# Patient Record
Sex: Female | Born: 1980 | Race: White | Hispanic: No | Marital: Single | Smoking: Former smoker
Health system: Southern US, Community
[De-identification: ages and names within clinical notes are randomized; demographics above are authoritative.]

## PROBLEM LIST (undated history)

## (undated) DIAGNOSIS — F988 Other specified behavioral and emotional disorders with onset usually occurring in childhood and adolescence: Secondary | ICD-10-CM

## (undated) DIAGNOSIS — R55 Syncope and collapse: Secondary | ICD-10-CM

## (undated) DIAGNOSIS — K219 Gastro-esophageal reflux disease without esophagitis: Secondary | ICD-10-CM

## (undated) DIAGNOSIS — L503 Dermatographic urticaria: Secondary | ICD-10-CM

## (undated) DIAGNOSIS — IMO0002 Reserved for concepts with insufficient information to code with codable children: Secondary | ICD-10-CM

## (undated) DIAGNOSIS — E663 Overweight: Secondary | ICD-10-CM

## (undated) DIAGNOSIS — R87619 Unspecified abnormal cytological findings in specimens from cervix uteri: Secondary | ICD-10-CM

## (undated) HISTORY — DX: Other specified behavioral and emotional disorders with onset usually occurring in childhood and adolescence: F98.8

## (undated) HISTORY — PX: OTHER SURGICAL HISTORY: SHX169

## (undated) HISTORY — DX: Syncope and collapse: R55

## (undated) HISTORY — PX: LEEP: SHX91

## (undated) HISTORY — PX: OVARIAN CYST REMOVAL: SHX89

## (undated) HISTORY — DX: Gastro-esophageal reflux disease without esophagitis: K21.9

## (undated) HISTORY — PX: WISDOM TOOTH EXTRACTION: SHX21

## (undated) HISTORY — DX: Overweight: E66.3

---
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
## 2001-02-03 ENCOUNTER — Encounter (INDEPENDENT_AMBULATORY_CARE_PROVIDER_SITE_OTHER): Payer: Self-pay | Admitting: Specialist

## 2001-02-03 ENCOUNTER — Other Ambulatory Visit: Admission: RE | Admit: 2001-02-03 | Discharge: 2001-02-03 | Payer: Self-pay | Admitting: *Deleted

## 2001-10-09 ENCOUNTER — Other Ambulatory Visit: Admission: RE | Admit: 2001-10-09 | Discharge: 2001-10-09 | Payer: Self-pay | Admitting: *Deleted

## 2002-10-29 ENCOUNTER — Other Ambulatory Visit: Admission: RE | Admit: 2002-10-29 | Discharge: 2002-10-29 | Payer: Self-pay | Admitting: *Deleted

## 2003-10-29 ENCOUNTER — Other Ambulatory Visit: Admission: RE | Admit: 2003-10-29 | Discharge: 2003-10-29 | Payer: Self-pay | Admitting: *Deleted

## 2004-10-22 ENCOUNTER — Other Ambulatory Visit: Admission: RE | Admit: 2004-10-22 | Discharge: 2004-10-22 | Payer: Self-pay | Admitting: *Deleted

## 2005-11-02 ENCOUNTER — Other Ambulatory Visit: Admission: RE | Admit: 2005-11-02 | Discharge: 2005-11-02 | Payer: Self-pay | Admitting: *Deleted

## 2006-01-10 ENCOUNTER — Other Ambulatory Visit: Admission: RE | Admit: 2006-01-10 | Discharge: 2006-01-10 | Payer: Self-pay | Admitting: *Deleted

## 2006-10-25 ENCOUNTER — Other Ambulatory Visit: Admission: RE | Admit: 2006-10-25 | Discharge: 2006-10-25 | Payer: Self-pay | Admitting: *Deleted

## 2007-11-06 ENCOUNTER — Other Ambulatory Visit: Admission: RE | Admit: 2007-11-06 | Discharge: 2007-11-06 | Payer: Self-pay | Admitting: *Deleted

## 2008-12-16 ENCOUNTER — Ambulatory Visit: Payer: Self-pay | Admitting: Internal Medicine

## 2011-08-10 DIAGNOSIS — F988 Other specified behavioral and emotional disorders with onset usually occurring in childhood and adolescence: Secondary | ICD-10-CM

## 2011-08-10 HISTORY — DX: Other specified behavioral and emotional disorders with onset usually occurring in childhood and adolescence: F98.8

## 2011-10-18 ENCOUNTER — Encounter: Payer: Self-pay | Admitting: Internal Medicine

## 2011-10-18 DIAGNOSIS — N943 Premenstrual tension syndrome: Secondary | ICD-10-CM | POA: Insufficient documentation

## 2011-10-25 ENCOUNTER — Ambulatory Visit (INDEPENDENT_AMBULATORY_CARE_PROVIDER_SITE_OTHER): Payer: BC Managed Care – PPO | Admitting: Internal Medicine

## 2011-10-25 ENCOUNTER — Encounter: Payer: Self-pay | Admitting: Internal Medicine

## 2011-10-25 VITALS — BP 104/64 | Temp 98.4°F | Wt 148.0 lb

## 2011-10-25 DIAGNOSIS — L503 Dermatographic urticaria: Secondary | ICD-10-CM

## 2011-10-25 DIAGNOSIS — R41840 Attention and concentration deficit: Secondary | ICD-10-CM

## 2011-10-25 DIAGNOSIS — K219 Gastro-esophageal reflux disease without esophagitis: Secondary | ICD-10-CM

## 2011-10-25 DIAGNOSIS — F419 Anxiety disorder, unspecified: Secondary | ICD-10-CM

## 2011-10-25 DIAGNOSIS — L509 Urticaria, unspecified: Secondary | ICD-10-CM | POA: Insufficient documentation

## 2011-10-25 DIAGNOSIS — F411 Generalized anxiety disorder: Secondary | ICD-10-CM

## 2011-10-25 HISTORY — DX: Dermatographic urticaria: L50.3

## 2011-10-25 NOTE — Patient Instructions (Signed)
Take Claritin 10 mg daily. Continue with over-the-counter acid blocker. Start Adderall 10 mg at 5 PM on the days you are trying to study. Do not take for work or class. Return in 4 weeks. Counseled regarding alcohol use with amphetamine as well as caffeine use with amphetamine

## 2011-10-25 NOTE — Progress Notes (Signed)
  Subjective:    Patient ID: Traci Rice, female    DOB: 1980-08-30, 30 y.o.   MRN: [id number]  HPI Patient not seen since 09/27/2008. 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 Is also in school he [employer] taking 13 hours. She goes to school 3 nights a week from 6 to 9 PM. Has been having trouble focusing getting her work done when studying on the night she's not in school. Thinks about doing other things. Also has had some issues with dermatographia  and urticaria. Has had GE reflux particularly when drinking soda. Has been taking over-the-counter acid blocker. This is been a hard semester. After this semester she plans to take 2 more classes in the fall of 2013 and finish her degree. She will not be attending school this summer. She took one of her friend's Adderall 30 mg tablets and only took 1/4 of a tablet and did well studying.    Review of Systems     Objective:   Physical Exam Patient not examined today. Spent 20 minutes talking with her about her issues and concerns. Tells me that if she rubs her arm red marks appear. Has had some welts on her neck at times. Has tried some over-the-counter antihistamine but not consistently. Denies being depressed. Just says she has a long her plate right nail and this is her heart is semester. When she's trying to study which is not critically high interest, she found herself thinking about other things to do such as household chores and really can't focus. Says she really didn't have much trouble in high school studying. She's only had this new job about 2 months. Sounds like there's a great deal going on in her life and she could be somewhat overweight.         Assessment & Plan:  Anxiety  GE reflux  Dermatographia  Urticaria  Possible attention deficit  Plan: Trial of Adderall 10 mg (not XR) one by mouth at 5 PM when studying (#30) no refill. Return in 4 weeks. Claritin 10 mg  daily to block histamine. Continue with over-the-counter GE reflux medication.  Time spent with patient 30 minutes

## 2011-11-29 ENCOUNTER — Ambulatory Visit: Payer: BC Managed Care – PPO | Admitting: Internal Medicine

## 2011-12-10 ENCOUNTER — Ambulatory Visit (INDEPENDENT_AMBULATORY_CARE_PROVIDER_SITE_OTHER): Payer: BC Managed Care – PPO | Admitting: Internal Medicine

## 2011-12-10 ENCOUNTER — Encounter: Payer: Self-pay | Admitting: Internal Medicine

## 2011-12-10 DIAGNOSIS — F988 Other specified behavioral and emotional disorders with onset usually occurring in childhood and adolescence: Secondary | ICD-10-CM

## 2011-12-10 NOTE — Patient Instructions (Addendum)
Take Adderall 10 mg twice daily as needed for attention deficit disorder. Return in 6 months for reevaluation. Call 24 hours and advance when new prescriptions are needed

## 2011-12-10 NOTE — Progress Notes (Signed)
  Subjective:    Patient ID: Traci Rice, female    DOB: [birth year], 31 y.o.   MRN: [id number]  HPI patient was seen recently for attention deficit disorder issues. She is now in school [employer]. Plans to go to summer school. She started taking Adderall 10 mg daily and subsequently increased that to twice daily. Seems to help her concentrate much better if she takes a second dose in the afternoon.    Review of Systems     Objective:   Physical Exam not examined spent 15 minutes talking with her about attention deficit disorder issues. She seems pleased with the results.           Assessment & Plan:  Attention deficit disorder  Plan: Prescription written for Adderall 10 mg (not XR) #60  with no refill. She may call in advance in the future for written prescription refills. I will to see her again in 6 months for reevaluation.

## 2012-02-01 ENCOUNTER — Telehealth: Payer: Self-pay | Admitting: Internal Medicine

## 2012-02-01 NOTE — Telephone Encounter (Signed)
New Rx for Adderall 10mg  (#60) one po bid no refill. Pt last seen for med check in May 2013.

## 2012-02-02 ENCOUNTER — Other Ambulatory Visit: Payer: Self-pay

## 2012-02-02 MED ORDER — AMPHETAMINE-DEXTROAMPHETAMINE 10 MG PO TABS: 10.0000 mg | ORAL_TABLET | Freq: Two times a day (BID) | ORAL | Status: DC

## 2012-03-17 ENCOUNTER — Telehealth: Payer: Self-pay | Admitting: Internal Medicine

## 2012-03-17 MED ORDER — AMPHETAMINE-DEXTROAMPHETAMINE 10 MG PO TABS: 10.0000 mg | ORAL_TABLET | Freq: Two times a day (BID) | ORAL | Status: DC

## 2012-03-17 NOTE — Telephone Encounter (Signed)
Rx printed for Adderall 10 mg bid #60

## 2012-04-21 ENCOUNTER — Other Ambulatory Visit: Payer: Self-pay | Admitting: Obstetrics and Gynecology

## 2012-05-09 ENCOUNTER — Telehealth: Payer: Self-pay | Admitting: Internal Medicine

## 2012-05-09 MED ORDER — AMPHETAMINE-DEXTROAMPHETAMINE 10 MG PO TABS: 10.0000 mg | ORAL_TABLET | Freq: Two times a day (BID) | ORAL | Status: DC

## 2012-05-09 NOTE — Telephone Encounter (Signed)
Rx given to pt.   6 mo recheck made for 06/20/12 @ 3:45.

## 2012-06-15 ENCOUNTER — Telehealth: Payer: Self-pay | Admitting: Internal Medicine

## 2012-06-15 MED ORDER — AMPHETAMINE-DEXTROAMPHETAMINE 10 MG PO TABS: 10.0000 mg | ORAL_TABLET | Freq: Two times a day (BID) | ORAL | Status: DC

## 2012-06-15 NOTE — Telephone Encounter (Signed)
Please print up Rx

## 2012-06-20 ENCOUNTER — Ambulatory Visit (INDEPENDENT_AMBULATORY_CARE_PROVIDER_SITE_OTHER): Payer: 59 | Admitting: Internal Medicine

## 2012-06-20 ENCOUNTER — Encounter: Payer: Self-pay | Admitting: Internal Medicine

## 2012-06-20 VITALS — BP 110/72 | Temp 98.5°F | Wt 137.0 lb

## 2012-06-20 DIAGNOSIS — F988 Other specified behavioral and emotional disorders with onset usually occurring in childhood and adolescence: Secondary | ICD-10-CM

## 2012-06-20 NOTE — Patient Instructions (Addendum)
Continue same dose of Adderall and return in 6 months.

## 2012-06-20 NOTE — Progress Notes (Signed)
  Subjective:    Patient ID: Traci Rice, female    DOB: 1980/09/19, 31 y.o.   MRN: [id number]  HPI 31 year old white female who since early summer 2013 has been working for her parents at their company [employer]. 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 Now she regrets this decision. She says it's hard to focus on many different tasks. It's hard to multitask. She been taking Adderall 10 mg twice daily now for some time. Initially it was to help her get through school at [employer] which she finished in the Spring of 2013. Feels that she may have developed some tolerance to this dose of Adderall. It wears off about 4:00pm. Then she goes to the gym to work out. Then she has ordinary evening activities. She's not studying anymore in the evenings so I do not see that that is an issue with the medication wearing off. Admits that it gives her some energy. I told her she is not taking it for anergy but for focusing. I told her not sure she really needs to continue with this medication since she's now out of school and doesn't really need to focus as much as she did previously when she was in school. It seems that she finds it very stressful for can for her parents. 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  She declines influenza immunization today.    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 That is the real issue. Do not want to change dose of medication at this point in time. We really need to think about whether or not she  needs his medication to focus in her current situation. 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 We will reevaluate in 6 months. She may be ready to get another job at that point.

## 2012-08-09 NOTE — L&D Delivery Note (Signed)
Delivery Note  SVD viable female Apgars 9,9 over 2nd degree ML lac.  Placenta delivered spontaneously intact with 3VC. Repair with 2-0 Chromic with good support and hemostasis noted and R/V exam confirms.  PH art was sent.  [place] cord blood was not done.  Mother and baby were doing well.  EBL 350cc  Candice Camp, MD

## 2012-10-16 LAB — OB RESULTS CONSOLE ABO/RH

## 2012-10-16 LAB — OB RESULTS CONSOLE HEPATITIS B SURFACE ANTIGEN: Hepatitis B Surface Ag: NEGATIVE

## 2012-10-16 LAB — OB RESULTS CONSOLE RUBELLA ANTIBODY, IGM: Rubella: IMMUNE

## 2012-10-16 LAB — OB RESULTS CONSOLE GC/CHLAMYDIA
Chlamydia: NEGATIVE
Gonorrhea: NEGATIVE

## 2013-01-30 ENCOUNTER — Ambulatory Visit (INDEPENDENT_AMBULATORY_CARE_PROVIDER_SITE_OTHER): Payer: BC Managed Care – PPO | Admitting: Internal Medicine

## 2013-01-30 ENCOUNTER — Encounter: Payer: Self-pay | Admitting: Internal Medicine

## 2013-01-30 VITALS — BP 110/56 | HR 88 | Temp 99.0°F | Wt 173.0 lb

## 2013-01-30 DIAGNOSIS — J029 Acute pharyngitis, unspecified: Secondary | ICD-10-CM

## 2013-01-30 DIAGNOSIS — J039 Acute tonsillitis, unspecified: Secondary | ICD-10-CM

## 2013-01-30 DIAGNOSIS — Z3492 Encounter for supervision of normal pregnancy, unspecified, second trimester: Secondary | ICD-10-CM

## 2013-01-30 DIAGNOSIS — Z331 Pregnant state, incidental: Secondary | ICD-10-CM

## 2013-01-30 NOTE — Patient Instructions (Addendum)
Take amoxicillin 500 mg 3 times daily for 10 days. May take Tylenol and Sudafed if needed.

## 2013-01-30 NOTE — Progress Notes (Signed)
  Subjective:    Patient ID: Traci Rice, female    DOB: [birth year], 32 y.o.   MRN: [id number]  HPI  Traci Rice is 32 years old in 6 months pregnant. Her pregnancy has been unremarkable so far and said she now has developed a sore throat a couple of days ago. She noticed her tonsils were quite red. She has some ear congestion. Some nasal congestion. No fever or shaking chills. No known drug allergies. She is worried about strep throat.    Review of Systems     Objective:   Physical Exam tonsils are enlarged and red without exudate. Rapid strep screen is negative. TMs are full but not red bilaterally. Right anterior cervical node. Neck is supple. Chest is clear to auscultation without rales or wheezing.        Assessment & Plan:  Tonsillitis  Six-month intrauterine pregnancy  Plan: Amoxicillin 500 mg 3 times daily for 10 days. Explained to her it is safe to take Tylenol, Robitussin, Sudafed while pregnant.

## 2013-02-01 ENCOUNTER — Telehealth: Payer: Self-pay

## 2013-02-01 NOTE — Telephone Encounter (Signed)
Suspect this is viral related and not an allergic reaction. Continue with antibiotic.

## 2013-02-01 NOTE — Telephone Encounter (Signed)
Patient is feeling better, throat wise, but has developed little red bumps on the palms of her hands and a couple of her toes. No itching, just sensitive to touch. No fever.

## 2013-02-02 NOTE — Telephone Encounter (Signed)
Patient informed. 

## 2013-04-04 LAB — OB RESULTS CONSOLE GBS: GBS: NEGATIVE

## 2013-04-10 ENCOUNTER — Inpatient Hospital Stay (HOSPITAL_COMMUNITY)
Admission: AD | Admit: 2013-04-10 | Discharge: 2013-04-10 | Disposition: A | Discharge status: Home / Self Care | Payer: BC Managed Care – PPO | Source: Ambulatory Visit | Attending: Obstetrics and Gynecology | Admitting: Obstetrics and Gynecology

## 2013-04-10 ENCOUNTER — Encounter (HOSPITAL_COMMUNITY): Payer: Self-pay | Admitting: *Deleted

## 2013-04-10 DIAGNOSIS — O479 False labor, unspecified: Secondary | ICD-10-CM | POA: Insufficient documentation

## 2013-04-10 HISTORY — DX: Unspecified abnormal cytological findings in specimens from cervix uteri: R87.619

## 2013-04-10 HISTORY — DX: Reserved for concepts with insufficient information to code with codable children: IMO0002

## 2013-04-16 ENCOUNTER — Inpatient Hospital Stay (HOSPITAL_COMMUNITY)
Admission: AD | Admit: 2013-04-16 | Discharge: 2013-04-18 | DRG: 373 | Disposition: A | Discharge status: Home / Self Care | Payer: BC Managed Care – PPO | Source: Ambulatory Visit | Attending: Obstetrics and Gynecology | Admitting: Obstetrics and Gynecology

## 2013-04-16 ENCOUNTER — Encounter (HOSPITAL_COMMUNITY): Payer: Self-pay | Admitting: Anesthesiology

## 2013-04-16 ENCOUNTER — Encounter (HOSPITAL_COMMUNITY): Payer: Self-pay | Admitting: *Deleted

## 2013-04-16 ENCOUNTER — Inpatient Hospital Stay (HOSPITAL_COMMUNITY): Payer: BC Managed Care – PPO | Admitting: Anesthesiology

## 2013-04-16 LAB — CBC
HCT: 35.1 % — ABNORMAL LOW (ref 36.0–46.0)
Hemoglobin: 12.2 g/dL (ref 12.0–15.0)
MCH: 31.1 pg (ref 26.0–34.0)
MCHC: 34.8 g/dL (ref 30.0–36.0)
MCV: 89.5 fL (ref 78.0–100.0)

## 2013-04-16 MED ORDER — EPHEDRINE 5 MG/ML INJ
10.0000 mg | INTRAVENOUS | Status: DC | PRN
  Filled 2013-04-16: qty 2

## 2013-04-16 MED ORDER — BUTORPHANOL TARTRATE 1 MG/ML IJ SOLN
1.0000 mg | Freq: Once | INTRAMUSCULAR | Status: AC
  Administered 2013-04-16: 1 mg via INTRAVENOUS
  Filled 2013-04-16: qty 1

## 2013-04-16 MED ORDER — DIPHENHYDRAMINE HCL 50 MG/ML IJ SOLN: 12.5000 mg | INTRAMUSCULAR | Status: DC | PRN

## 2013-04-16 MED ORDER — EPHEDRINE 5 MG/ML INJ
10.0000 mg | INTRAVENOUS | Status: DC | PRN
  Filled 2013-04-16: qty 4
  Filled 2013-04-16: qty 2

## 2013-04-16 MED ORDER — DIBUCAINE 1 % RE OINT: 1.0000 "application " | TOPICAL_OINTMENT | RECTAL | Status: DC | PRN

## 2013-04-16 MED ORDER — ONDANSETRON HCL 4 MG PO TABS: 4.0000 mg | ORAL_TABLET | ORAL | Status: DC | PRN

## 2013-04-16 MED ORDER — ZOLPIDEM TARTRATE 5 MG PO TABS: 5.0000 mg | ORAL_TABLET | Freq: Every evening | ORAL | Status: DC | PRN

## 2013-04-16 MED ORDER — OXYTOCIN 40 UNITS IN LACTATED RINGERS INFUSION - SIMPLE MED
1.0000 m[IU]/min | INTRAVENOUS | Status: DC
  Administered 2013-04-16: 2 m[IU]/min via INTRAVENOUS
  Filled 2013-04-16: qty 1000

## 2013-04-16 MED ORDER — FLEET ENEMA 7-19 GM/118ML RE ENEM: 1.0000 | ENEMA | RECTAL | Status: DC | PRN

## 2013-04-16 MED ORDER — PRENATAL MULTIVITAMIN CH
1.0000 | ORAL_TABLET | Freq: Every day | ORAL | Status: DC
  Filled 2013-04-16: qty 1

## 2013-04-16 MED ORDER — CITRIC ACID-SODIUM CITRATE 334-500 MG/5ML PO SOLN: 30.0000 mL | ORAL | Status: DC | PRN

## 2013-04-16 MED ORDER — IBUPROFEN 600 MG PO TABS: 600.0000 mg | ORAL_TABLET | Freq: Four times a day (QID) | ORAL | Status: DC | PRN

## 2013-04-16 MED ORDER — LACTATED RINGERS IV SOLN
500.0000 mL | Freq: Once | INTRAVENOUS | Status: AC
  Administered 2013-04-16: 500 mL via INTRAVENOUS

## 2013-04-16 MED ORDER — PROMETHAZINE HCL 25 MG/ML IJ SOLN
12.5000 mg | Freq: Four times a day (QID) | INTRAMUSCULAR | Status: DC | PRN
  Administered 2013-04-16: 12.5 mg via INTRAVENOUS
  Filled 2013-04-16: qty 1

## 2013-04-16 MED ORDER — PRENATAL MULTIVITAMIN CH: 1.0000 | ORAL_TABLET | Freq: Every day | ORAL | Status: DC

## 2013-04-16 MED ORDER — BENZOCAINE-MENTHOL 20-0.5 % EX AERO
1.0000 "application " | INHALATION_SPRAY | CUTANEOUS | Status: DC | PRN
  Administered 2013-04-17: 1 via TOPICAL
  Filled 2013-04-16: qty 56

## 2013-04-16 MED ORDER — ONDANSETRON HCL 4 MG/2ML IJ SOLN: 4.0000 mg | INTRAMUSCULAR | Status: DC | PRN

## 2013-04-16 MED ORDER — OXYCODONE-ACETAMINOPHEN 5-325 MG PO TABS: 1.0000 | ORAL_TABLET | ORAL | Status: DC | PRN

## 2013-04-16 MED ORDER — SIMETHICONE 80 MG PO CHEW: 80.0000 mg | CHEWABLE_TABLET | ORAL | Status: DC | PRN

## 2013-04-16 MED ORDER — SENNOSIDES-DOCUSATE SODIUM 8.6-50 MG PO TABS: 2.0000 | ORAL_TABLET | Freq: Every day | ORAL | Status: DC

## 2013-04-16 MED ORDER — PHENYLEPHRINE 40 MCG/ML (10ML) SYRINGE FOR IV PUSH (FOR BLOOD PRESSURE SUPPORT)
80.0000 ug | PREFILLED_SYRINGE | INTRAVENOUS | Status: DC | PRN
  Filled 2013-04-16: qty 2

## 2013-04-16 MED ORDER — LACTATED RINGERS IV SOLN: 500.0000 mL | INTRAVENOUS | Status: DC | PRN

## 2013-04-16 MED ORDER — WITCH HAZEL-GLYCERIN EX PADS: 1.0000 "application " | MEDICATED_PAD | CUTANEOUS | Status: DC | PRN

## 2013-04-16 MED ORDER — IBUPROFEN 600 MG PO TABS
600.0000 mg | ORAL_TABLET | Freq: Four times a day (QID) | ORAL | Status: DC
  Administered 2013-04-16 – 2013-04-18 (×7): 600 mg via ORAL
  Filled 2013-04-16 (×7): qty 1

## 2013-04-16 MED ORDER — LACTATED RINGERS IV SOLN
INTRAVENOUS | Status: DC
  Administered 2013-04-16: 125 mL/h via INTRAVENOUS

## 2013-04-16 MED ORDER — MEDROXYPROGESTERONE ACETATE 150 MG/ML IM SUSP: 150.0000 mg | INTRAMUSCULAR | Status: DC | PRN

## 2013-04-16 MED ORDER — PHENYLEPHRINE 40 MCG/ML (10ML) SYRINGE FOR IV PUSH (FOR BLOOD PRESSURE SUPPORT)
80.0000 ug | PREFILLED_SYRINGE | INTRAVENOUS | Status: DC | PRN
  Filled 2013-04-16: qty 2
  Filled 2013-04-16: qty 5

## 2013-04-16 MED ORDER — OXYTOCIN 40 UNITS IN LACTATED RINGERS INFUSION - SIMPLE MED
62.5000 mL/h | INTRAVENOUS | Status: DC
  Administered 2013-04-16: 62.5 mL/h via INTRAVENOUS

## 2013-04-16 MED ORDER — ACETAMINOPHEN 325 MG PO TABS: 650.0000 mg | ORAL_TABLET | ORAL | Status: DC | PRN

## 2013-04-16 MED ORDER — FENTANYL 2.5 MCG/ML BUPIVACAINE 1/10 % EPIDURAL INFUSION (WH - ANES)
14.0000 mL/h | INTRAMUSCULAR | Status: DC | PRN
  Administered 2013-04-16: 14 mL/h via EPIDURAL
  Filled 2013-04-16: qty 125

## 2013-04-16 MED ORDER — LIDOCAINE HCL (PF) 1 % IJ SOLN
30.0000 mL | INTRAMUSCULAR | Status: DC | PRN
  Filled 2013-04-16 (×2): qty 30

## 2013-04-16 MED ORDER — DIPHENHYDRAMINE HCL 25 MG PO CAPS: 25.0000 mg | ORAL_CAPSULE | Freq: Four times a day (QID) | ORAL | Status: DC | PRN

## 2013-04-16 MED ORDER — TETANUS-DIPHTH-ACELL PERTUSSIS 5-2.5-18.5 LF-MCG/0.5 IM SUSP: 0.5000 mL | Freq: Once | INTRAMUSCULAR | Status: DC

## 2013-04-16 MED ORDER — OXYTOCIN BOLUS FROM INFUSION: 500.0000 mL | INTRAVENOUS | Status: DC

## 2013-04-16 MED ORDER — MEASLES, MUMPS & RUBELLA VAC ~~LOC~~ INJ: 0.5000 mL | INJECTION | Freq: Once | SUBCUTANEOUS | Status: DC

## 2013-04-16 MED ORDER — TERBUTALINE SULFATE 1 MG/ML IJ SOLN: 0.2500 mg | Freq: Once | INTRAMUSCULAR | Status: DC | PRN

## 2013-04-16 MED ORDER — ONDANSETRON HCL 4 MG/2ML IJ SOLN: 4.0000 mg | Freq: Four times a day (QID) | INTRAMUSCULAR | Status: DC | PRN

## 2013-04-16 MED ORDER — SODIUM BICARBONATE 8.4 % IV SOLN
INTRAVENOUS | Status: DC | PRN
  Administered 2013-04-16: 5 mL via EPIDURAL

## 2013-04-16 MED ORDER — LANOLIN HYDROUS EX OINT: TOPICAL_OINTMENT | CUTANEOUS | Status: DC | PRN

## 2013-04-16 NOTE — Progress Notes (Signed)
Pt grossly ruptured. Fern positive.

## 2013-04-16 NOTE — Anesthesia Procedure Notes (Signed)

## 2013-04-16 NOTE — MAU Note (Signed)
Pt G1 at 37.6wks, leaking clear fluid since 0500.  Mild contractions, no bleeding.

## 2013-04-16 NOTE — H&P (Signed)
Traci Rice is a 32 y.o. female presenting for SROM and early labor.  Pregnancy uncomplicated.  GBS-. History OB History   Grav Para Term Preterm Abortions TAB SAB Ect Mult Living   1              Past Medical History  Diagnosis Date  . Syncope   . PMS (premenstrual syndrome)   . Abnormal Pap smear    Past Surgical History  Procedure Laterality Date  . Leep    . Laporoscopy      remove cyst from left ovary  . Ovarian cyst removal     Family History: family history includes Cancer in her paternal grandmother; Diabetes in her maternal grandmother, mother, and paternal grandmother; Heart disease in her paternal aunt; Stroke in her paternal grandfather. Social History:  reports that she quit smoking about 8 months ago. Her smoking use included Cigarettes. She smoked 0.00 packs per day. She has never used smokeless tobacco. She reports that she does not drink alcohol or use illicit drugs.   Prenatal Transfer Tool  Maternal Diabetes: No Genetic Screening: Normal Maternal Ultrasounds/Referrals: Normal Fetal Ultrasounds or other Referrals:  None Maternal Substance Abuse:  No Significant Maternal Medications:  None Significant Maternal Lab Results:  None Other Comments:  None  ROS  Dilation: 3 Effacement (%): 100 Station: -2 Exam by:: Eathon Valade Blood pressure 130/85, pulse 94, temperature 98.6 F (37 Rice), temperature source Oral, resp. rate 20, height 5' 4.4" (1.636 m), weight 87.816 kg (193 lb 9.6 oz), last menstrual period 05/30/2012. Exam Physical Exam  Prenatal labs: ABO, Rh: A/Positive/-- (03/10 0000) Antibody: Negative (03/10 0000) Rubella: Immune (03/10 0000) RPR: Nonreactive (03/10 0000)  HBsAg: Negative (03/10 0000)  HIV: Non-reactive (03/10 0000)  GBS: Negative (08/27 0000)   Assessment/Plan: IUP at term SROM and early labor Augment with Pitocin Anticipate SVD   Traci Rice 04/16/2013, 8:37 AM

## 2013-04-16 NOTE — Anesthesia Preprocedure Evaluation (Signed)
Anesthesia Evaluation  Patient identified by MRN, date of birth, ID band Patient awake    Reviewed: Allergy & Precautions, H&P , Patient's Chart, lab work & pertinent test results  Airway Mallampati: II  TM Distance: >3 FB Neck ROM: full    Dental  (+) Teeth Intact   Pulmonary  breath sounds clear to auscultation        Cardiovascular Rhythm:regular Rate:Normal     Neuro/Psych    GI/Hepatic Medicated,  Endo/Other    Renal/GU      Musculoskeletal   Abdominal   Peds  Hematology   Anesthesia Other Findings       Reproductive/Obstetrics (+) Pregnancy                             Anesthesia Physical Anesthesia Plan  ASA: II  Anesthesia Plan: Epidural   Post-op Pain Management:    Induction:   Airway Management Planned:   Additional Equipment:   Intra-op Plan:   Post-operative Plan:   Informed Consent: I have reviewed the patients History and Physical, chart, labs and discussed the procedure including the risks, benefits and alternatives for the proposed anesthesia with the patient or authorized representative who has indicated his/her understanding and acceptance.   Dental Advisory Given  Plan Discussed with:   Anesthesia Plan Comments: (Labs checked- platelets confirmed with RN in room. Fetal heart tracing, per RN, reported to be stable enough for sitting procedure. Discussed epidural, and patient consents to the procedure:  included risk of possible headache,backache, failed block, allergic reaction, and nerve injury. This patient was asked if she had any questions or concerns before the procedure started.)        Anesthesia Quick Evaluation  

## 2013-04-17 LAB — CBC
Platelets: 189 10*3/uL (ref 150–400)
RDW: 13 % (ref 11.5–15.5)
WBC: 14.1 10*3/uL — ABNORMAL HIGH (ref 4.0–10.5)

## 2013-04-17 NOTE — Progress Notes (Signed)
Patient has bruises to both nipples and is extremely sore.  Upon entering her room this morning she asked for formula and said that she didn't want to breastfeed anymore.   Discussed with patient the benefits of breastfeeding.  Patient had been given sore nipple shells, lanolin, and a nipple shield (which hadn't been started at that time).  She decided that she would give pumping a try and would pump and bottle feed instead, while supplementing with formula until her milk comes in.  We made a new plan and I discussed this plan with Rayfield Citizen from [facility].  I also returned the unopened nipple shield to [facility] so she could be credited that charge.  Patient is happy at this moment with her choice of feeding.   Cox, Caydn Justen M

## 2013-04-17 NOTE — Progress Notes (Signed)
Post Partum Day 1 Subjective: no complaints, up ad lib, voiding, tolerating PO and + flatus  Objective: Blood pressure 113/74, pulse 96, temperature 98.3 F (36.8 C), temperature source Oral, resp. rate 18, height 5' 4.4" (1.636 m), weight 193 lb 9.6 oz (87.816 kg), last menstrual period 05/30/2012, SpO2 100.00%, unknown if currently breastfeeding.  Physical Exam:  General: alert and cooperative Lochia: appropriate Uterine Fundus: firm Incision: perineum intact DVT Evaluation: No evidence of DVT seen on physical exam. Negative Homan's sign. No cords or calf tenderness. No significant calf/ankle edema.   Recent Labs  04/16/13 0650 04/17/13 0545  HGB 12.2 10.5*  HCT 35.1* 31.0*    Assessment/Plan: Plan for discharge tomorrow   LOS: 1 day   CURTIS,CAROL G 04/17/2013, 8:10 AM

## 2013-04-17 NOTE — Lactation Note (Signed)
This note was copied from the chart of Traci Rice. Lactation Consultation Note  Patient Name: Traci Rice [id number] Date: 04/17/2013 Reason for consult: Initial assessment Mom stopped putting the baby to the breast due to nipple pain. Bilateral nipples are bruised red, nipples are flat with some aerola edema. Mom has DEBP set up and was given shells to wear but she does not have shells on at present. Offered to demonstrate a nipple shield if Mom wanted to get baby back to the breast. She declined and at this point is not sure if she wants to pump and bottle or change to formula and bottle feeding. Discussed the benefits of giving breast milk to baby v/s formula. Discussed benefits of BF for Mom. Advised Mom if she decides to continue to pump and bottle feed, stressed importance of pumping every 3 hours for 15 minutes on Preemie setting. Advised to call insurance company regarding obtaining a DEBP for home use. If Mom decides to formula and bottle feed, then advised not to pump. Encouraged Mom to call if she has questions or concerns or would like LC assist. Care for sore nipples reviewed with Mom and comfort gels given with instructions.   Maternal Data Formula Feeding for Exclusion: Yes Reason for exclusion: Mother's choice to formula and breast feed on admission Infant to breast within first hour of birth: Yes Has patient been taught Hand Expression?: Yes (Mom reports RN demonstrated hand expression) Does the patient have breastfeeding experience prior to this delivery?: No  Feeding Feeding Type: Formula Nipple Type: Regular  LATCH Score/Interventions       Type of Nipple: Flat  Comfort (Breast/Nipple): Filling, red/small blisters or bruises, mild/mod discomfort  Problem noted: Cracked, bleeding, blisters, bruises;Severe discomfort Interventions  (Cracked/bleeding/bruising/blister): Expressed breast milk to nipple ( comfort gels given with instructions)         Lactation Tools Discussed/Used Tools: Pump;Shells;Comfort gels Shell Type: Inverted Breast pump type: Double-Electric Breast Pump   Consult Status Consult Status: PRN    Alfred Levins 04/17/2013, 5:35 PM

## 2013-04-17 NOTE — Anesthesia Postprocedure Evaluation (Signed)
  Anesthesia Post-op Note  Patient: Traci Rice  Procedure(s) Performed: * No procedures listed *  Patient Location: PACU and Mother/Baby  Anesthesia Type:Epidural  Level of Consciousness: awake, alert  and oriented  Airway and Oxygen Therapy: Patient Spontanous Breathing  Post-op Pain: none  Post-op Assessment: Post-op Vital signs reviewed, Patient's Cardiovascular Status Stable, No headache, No backache, No residual numbness and No residual motor weakness  Post-op Vital Signs: Reviewed and stable  Complications: No apparent anesthesia complications

## 2013-04-18 NOTE — Discharge Summary (Signed)
Obstetric Discharge Summary Reason for Admission: onset of labor and rupture of membranes Prenatal Procedures: ultrasound Intrapartum Procedures: spontaneous vaginal delivery Postpartum Procedures: none Complications-Operative and Postpartum: 2 degree perineal laceration Hemoglobin  Date Value Range Status  04/17/2013 10.5* 12.0 - 15.0 g/dL Final     HCT  Date Value Range Status  04/17/2013 31.0* 36.0 - 46.0 % Final    Physical Exam:  General: alert and cooperative Lochia: appropriate Uterine Fundus: firm Incision: perineum intact DVT Evaluation: No evidence of DVT seen on physical exam. Negative Homan's sign. No cords or calf tenderness. No significant calf/ankle edema.  Discharge Diagnoses: Term Pregnancy-delivered  Discharge Information: Date: 04/18/2013 Activity: pelvic rest Diet: routine Medications: PNV and Ibuprofen Condition: stable Instructions: refer to practice specific booklet Discharge to: home   Newborn Data: Live born female  Birth Weight: 7 lb 8.8 oz (3425 g) APGAR: 9, 9  Home with mother.  Dionne Knoop G 04/18/2013, 8:33 AM

## 2014-04-22 ENCOUNTER — Ambulatory Visit (INDEPENDENT_AMBULATORY_CARE_PROVIDER_SITE_OTHER): Payer: BC Managed Care – PPO | Admitting: Internal Medicine

## 2014-04-22 ENCOUNTER — Encounter: Payer: Self-pay | Admitting: Internal Medicine

## 2014-04-22 VITALS — BP 102/72 | HR 68 | Ht 64.5 in | Wt 193.0 lb

## 2014-04-22 DIAGNOSIS — F411 Generalized anxiety disorder: Secondary | ICD-10-CM

## 2014-04-22 MED ORDER — SERTRALINE HCL 50 MG PO TABS: 50.0000 mg | ORAL_TABLET | Freq: Every day | ORAL | Status: DC

## 2014-05-07 NOTE — Progress Notes (Signed)
   Subjective:    Patient ID: Traci Rice, female    DOB: 02/24/1981, 33 y.o.   MRN: [id number]  HPI 33 year old Female not seen since June 2014. In September 2014 she delivered a baby. She has a long-standing history of anxiety and comes in today to discuss anxiety. Prefers not to be on antianxiety medication but would prefer to try something like SSRI. Doesn't was something that will cause weight gain.    Review of Systems     Objective:   Physical Exam  Spent 20 minutes speaking with patient about these issues. Her affect is normal. Judgment is normal.      Assessment & Plan:  Anxiety disorder  Plan: Start with Zoloft 50 mg daily and followup in 6 weeks.

## 2014-05-07 NOTE — Patient Instructions (Signed)
Start Zoloft 50 mg daily and return in 6 weeks

## 2014-06-07 ENCOUNTER — Other Ambulatory Visit: Payer: Self-pay | Admitting: Obstetrics and Gynecology

## 2014-06-07 ENCOUNTER — Ambulatory Visit: Payer: BC Managed Care – PPO | Admitting: Internal Medicine

## 2014-06-10 ENCOUNTER — Encounter: Payer: Self-pay | Admitting: Internal Medicine

## 2014-06-10 LAB — CYTOLOGY - PAP

## 2014-06-13 ENCOUNTER — Encounter: Payer: Self-pay | Admitting: Internal Medicine

## 2014-06-13 ENCOUNTER — Ambulatory Visit (INDEPENDENT_AMBULATORY_CARE_PROVIDER_SITE_OTHER): Payer: BC Managed Care – PPO | Admitting: Internal Medicine

## 2014-06-13 VITALS — BP 98/72 | HR 99 | Temp 97.9°F | Wt 157.0 lb

## 2014-06-13 DIAGNOSIS — F411 Generalized anxiety disorder: Secondary | ICD-10-CM

## 2014-06-13 MED ORDER — SERTRALINE HCL 100 MG PO TABS: 100.0000 mg | ORAL_TABLET | Freq: Every day | ORAL | Status: DC

## 2014-06-13 NOTE — Patient Instructions (Addendum)
Increase Zoloft to 100 mg daily. Return in 6 months.

## 2014-09-14 NOTE — Progress Notes (Signed)
   Subjective:    Patient ID: Traci Rice, female    DOB: 02/15/1981, 34 y.o.   MRN: [id number]  HPI 6 week follow-up visit for anxiety. Was seen in September for anxiety issues status post new baby. Started on Zoloft 50 mg daily. Is done well with that medication. No side effects.    Review of Systems     Objective:   Physical Exam  Not examined. The 15 minutes speaking with patient. Her affect is normal and much brighter and calmer than last visit.      Assessment & Plan:  Anxiety  Plan: Increase Zoloft to 100 mg daily. Return in 6 months or as needed.

## 2014-11-19 ENCOUNTER — Encounter: Payer: Self-pay | Admitting: Internal Medicine

## 2014-11-19 ENCOUNTER — Ambulatory Visit (INDEPENDENT_AMBULATORY_CARE_PROVIDER_SITE_OTHER): Payer: BLUE CROSS/BLUE SHIELD | Admitting: Internal Medicine

## 2014-11-19 VITALS — BP 100/70 | HR 72 | Temp 97.9°F

## 2014-11-19 DIAGNOSIS — R197 Diarrhea, unspecified: Secondary | ICD-10-CM

## 2014-11-19 DIAGNOSIS — F411 Generalized anxiety disorder: Secondary | ICD-10-CM

## 2014-11-19 MED ORDER — SERTRALINE HCL 100 MG PO TABS: 100.0000 mg | ORAL_TABLET | Freq: Every day | ORAL | Status: DC

## 2014-12-07 ENCOUNTER — Encounter: Payer: Self-pay | Admitting: Internal Medicine

## 2014-12-07 NOTE — Patient Instructions (Signed)
Recommend clear liquid diet until diarrhea has stopped for 24 hours and then advance diet slowly. Continue Zoloft 100 mg daily. Return in 6 months.

## 2014-12-07 NOTE — Progress Notes (Signed)
   Subjective:    Patient ID: Traci Rice, female    DOB: 12/30/1980, 34 y.o.   MRN: [id number]  HPI Was last seen in early November for follow-up on anxiety. She is on Zoloft and has done well. She has her daughter with her today. Daughter was born in 2014. Also she began having diarrhea today and has had multiple episodes. Likely has viral gastroenteritis. She has no fever.    Review of Systems     Objective:   Physical Exam Spent 15 minutes speaking with patient about anxiety issues.       Assessment & Plan:  Generalized anxiety disorder  Diarrhea  Plan: Refill Zoloft for 6 months. Return in 6 months. With regard to diarrhea, clear liquid diet until diarrhea resolves for 24 hours then advance diet slowly.

## 2015-05-21 ENCOUNTER — Encounter: Payer: Medicaid Other | Admitting: Obstetrics & Gynecology

## 2015-05-22 ENCOUNTER — Other Ambulatory Visit: Payer: BLUE CROSS/BLUE SHIELD | Admitting: Internal Medicine

## 2015-05-22 DIAGNOSIS — Z130 Encounter for screening for diseases of the blood and blood-forming organs and certain disorders involving the immune mechanism: Secondary | ICD-10-CM

## 2015-05-22 DIAGNOSIS — Z0000 Encounter for general adult medical examination without abnormal findings: Secondary | ICD-10-CM

## 2015-05-22 DIAGNOSIS — Z1321 Encounter for screening for nutritional disorder: Secondary | ICD-10-CM

## 2015-05-22 DIAGNOSIS — Z13220 Encounter for screening for lipoid disorders: Secondary | ICD-10-CM

## 2015-05-22 DIAGNOSIS — Z1329 Encounter for screening for other suspected endocrine disorder: Secondary | ICD-10-CM

## 2015-05-22 LAB — LIPID PANEL
Cholesterol: 152 mg/dL (ref 125–200)
HDL: 56 mg/dL (ref >=46)
LDL CALC: 86 mg/dL (ref <130)
TRIGLYCERIDES: 51 mg/dL (ref <150)
Total CHOL/HDL Ratio: 2.7 Ratio (ref <=5.0)
VLDL: 10 mg/dL (ref <30)

## 2015-05-22 LAB — CBC WITH DIFFERENTIAL/PLATELET
BASOS ABS: 0 10*3/uL (ref 0.0–0.1)
Basophils Relative: 0 % (ref 0–1)
EOS ABS: 0.1 10*3/uL (ref 0.0–0.7)
EOS PCT: 1 % (ref 0–5)
HCT: 40.4 % (ref 36.0–46.0)
Hemoglobin: 13.5 g/dL (ref 12.0–15.0)
Lymphocytes Relative: 26 % (ref 12–46)
Lymphs Abs: 1.7 10*3/uL (ref 0.7–4.0)
MCH: 30.5 pg (ref 26.0–34.0)
MCHC: 33.4 g/dL (ref 30.0–36.0)
MCV: 91.4 fL (ref 78.0–100.0)
MPV: 10.2 fL (ref 8.6–12.4)
Monocytes Absolute: 0.7 10*3/uL (ref 0.1–1.0)
Monocytes Relative: 11 % (ref 3–12)
NEUTROS PCT: 62 % (ref 43–77)
Neutro Abs: 4 10*3/uL (ref 1.7–7.7)
PLATELETS: 290 10*3/uL (ref 150–400)
RBC: 4.42 MIL/uL (ref 3.87–5.11)
RDW: 12.7 % (ref 11.5–15.5)
WBC: 6.4 10*3/uL (ref 4.0–10.5)

## 2015-05-22 LAB — TSH: TSH: 0.616 u[IU]/mL (ref 0.350–4.500)

## 2015-05-22 LAB — COMPLETE METABOLIC PANEL WITH GFR
ALT: 9 U/L (ref 6–29)
AST: 14 U/L (ref 10–30)
Albumin: 4.5 g/dL (ref 3.6–5.1)
Alkaline Phosphatase: 72 U/L (ref 33–115)
BUN: 9 mg/dL (ref 7–25)
CHLORIDE: 102 mmol/L (ref 98–110)
CO2: 27 mmol/L (ref 20–31)
CREATININE: 0.58 mg/dL (ref 0.50–1.10)
Calcium: 9.7 mg/dL (ref 8.6–10.2)
GFR, Est African American: >89 mL/min (ref >=60)
GFR, Est Non African American: >89 mL/min (ref >=60)
GLUCOSE: 80 mg/dL (ref 65–99)
Potassium: 4.3 mmol/L (ref 3.5–5.3)
SODIUM: 139 mmol/L (ref 135–146)
Total Bilirubin: 0.7 mg/dL (ref 0.2–1.2)
Total Protein: 7.1 g/dL (ref 6.1–8.1)

## 2015-05-23 ENCOUNTER — Encounter: Payer: Self-pay | Admitting: Internal Medicine

## 2015-05-23 ENCOUNTER — Ambulatory Visit (INDEPENDENT_AMBULATORY_CARE_PROVIDER_SITE_OTHER): Payer: BLUE CROSS/BLUE SHIELD | Admitting: Internal Medicine

## 2015-05-23 ENCOUNTER — Encounter: Payer: Self-pay | Admitting: Obstetrics & Gynecology

## 2015-05-23 ENCOUNTER — Ambulatory Visit (INDEPENDENT_AMBULATORY_CARE_PROVIDER_SITE_OTHER): Payer: BLUE CROSS/BLUE SHIELD | Admitting: Obstetrics & Gynecology

## 2015-05-23 VITALS — BP 100/64 | HR 91 | Resp 18 | Ht 64.5 in | Wt 153.0 lb

## 2015-05-23 VITALS — BP 104/64 | HR 83 | Temp 98.1°F | Ht 65.0 in | Wt 153.0 lb

## 2015-05-23 DIAGNOSIS — Z0000 Encounter for general adult medical examination without abnormal findings: Secondary | ICD-10-CM | POA: Diagnosis not present

## 2015-05-23 DIAGNOSIS — Z30432 Encounter for removal of intrauterine contraceptive device: Secondary | ICD-10-CM | POA: Diagnosis not present

## 2015-05-23 DIAGNOSIS — N898 Other specified noninflammatory disorders of vagina: Secondary | ICD-10-CM

## 2015-05-23 DIAGNOSIS — Z1151 Encounter for screening for human papillomavirus (HPV): Secondary | ICD-10-CM

## 2015-05-23 DIAGNOSIS — Z01419 Encounter for gynecological examination (general) (routine) without abnormal findings: Secondary | ICD-10-CM

## 2015-05-23 DIAGNOSIS — Z124 Encounter for screening for malignant neoplasm of cervix: Secondary | ICD-10-CM | POA: Diagnosis not present

## 2015-05-23 LAB — POCT URINALYSIS DIPSTICK
Bilirubin, UA: NEGATIVE
Blood, UA: NEGATIVE
GLUCOSE UA: NEGATIVE
Ketones, UA: NEGATIVE
Leukocytes, UA: NEGATIVE
NITRITE UA: NEGATIVE
Protein, UA: NEGATIVE
Spec Grav, UA: 1.02
UROBILINOGEN UA: NEGATIVE
pH, UA: 6.5

## 2015-05-23 LAB — VITAMIN D 25 HYDROXY (VIT D DEFICIENCY, FRACTURES): Vit D, 25-Hydroxy: 34 ng/mL (ref 30–100)

## 2015-05-23 NOTE — Progress Notes (Signed)
Subjective:    Traci Rice is a 34 y.o. engaged W 40P1 (33 yo daughter) female who presents for an annual exam. The patient has no complaints today. She would like to conceive so she wants her Mirena removed. The patient is sexually active. GYN screening history: last pap: was normal. The patient wears seatbelts: yes. The patient participates in regular exercise: no. Has the patient ever been transfused or tattooed?: no. The patient reports that there is not domestic violence in her life.   Menstrual History: OB History    Gravida Para Term Preterm AB TAB SAB Ectopic Multiple Living   1 1 1       1       Menarche age: [age]  No LMP recorded. Patient is not currently having periods (Reason: IUD).    The following portions of the patient's history were reviewed and updated as appropriate: allergies, current medications, past family history, past medical history, past social history, past surgical history and problem list.  Review of Systems Pertinent items noted in HPI and remainder of comprehensive ROS otherwise negative. Monogamous for 5 years, lives together. Occasional, positional dyspareunia. She had a LEEP about 10 years ago. She has had her flu vaccine this season.   Objective:    BP 100/64 mmHg  Pulse 91  Resp 18  Ht 5' 4.5" (1.638 m)  Wt 153 lb (69.4 kg)  BMI 25.87 kg/m2  General Appearance:    Alert, cooperative, no distress, appears stated age  Head:    Normocephalic, without obvious abnormality, atraumatic  Eyes:    PERRL, conjunctiva/corneas clear, EOM's intact, fundi    benign, both eyes  Ears:    Normal TM's and external ear canals, both ears  Nose:   Nares normal, septum midline, mucosa normal, no drainage    or sinus tenderness  Throat:   Lips, mucosa, and tongue normal; teeth and gums normal  Neck:   Supple, symmetrical, trachea midline, no adenopathy;    thyroid:  no enlargement/tenderness/nodules; no carotid   bruit or JVD  Back:     Symmetric, no curvature, ROM  normal, no CVA tenderness  Lungs:     Clear to auscultation bilaterally, respirations unlabored  Chest Wall:    No tenderness or deformity   Heart:    Regular rate and rhythm, S1 and S2 normal, no murmur, rub   or gallop  Breast Exam:    No tenderness, masses, or nipple abnormality  Abdomen:     Soft, non-tender, bowel sounds active all four quadrants,    no masses, no organomegaly  Genitalia:    Normal female without lesion, discharge or tenderness, NSSA, NT, mobile, normal adnexal exam, Mirena easily removed and noted to be intact     Extremities:   Extremities normal, atraumatic, no cyanosis or edema  Pulses:   2+ and symmetric all extremities  Skin:   Skin color, texture, turgor normal, no rashes or lesions  Lymph nodes:   Cervical, supraclavicular, and axillary nodes normal  Neurologic:   CNII-XII intact, normal strength, sensation and reflexes    throughout  .    Assessment:    Healthy female exam.    Plan:     Breast self exam technique reviewed and patient encouraged to perform self-exam monthly. Thin prep Pap smear. with cotesting Rec MVI daily to decrease risk of ONTDs

## 2015-05-23 NOTE — Addendum Note (Signed)
Addended by: Gita KudoLASSITER, Yeva Bissette S on: 05/23/2015 11:17 AM   Modules accepted: Orders

## 2015-05-26 LAB — WET PREP, GENITAL
CLUE CELLS WET PREP: NONE SEEN
Trich, Wet Prep: NONE SEEN
YEAST WET PREP: NONE SEEN

## 2015-05-28 LAB — CYTOLOGY - PAP

## 2015-06-08 NOTE — Patient Instructions (Signed)
Was a pleasure to see you today. Return in 2 years or as needed. See GYN annually.

## 2015-06-08 NOTE — Progress Notes (Signed)
   Subjective:    Patient ID: Traci Rice, female    DOB: 06/26/1981, 34 y.o.   MRN: [id number]  HPI Pleasant 34 year old Female in today for health maintenance exam. She has a history of anxiety, attention deficit, urticaria, dermatographia and GE reflux.  No history of accidents or operations.  Social history: She has 1 child. Engaged. Five-year monogamous relationship. [place].  Family history: Father with GE reflux status post hip replacement. Mother with history of hypertension and anxiety.  No known drug allergies  Initially presented here in 2000 for tonsillitis. Vasovagal syncope 2001.  History of smoking in 2003.  Has GYN physician, [name]. Had Mirena removed today. Thinking about having another child. LEEP procedure about 10 years ago. History of anxiety treated with Zoloft but no longer taking that medication. Doesn't feel she needs it.   Review of Systems  Constitutional: Negative.   All other systems reviewed and are negative.      Objective:   Physical Exam  Constitutional: She is oriented to person, place, and time. She appears well-developed and well-nourished. No distress.  HENT:  Head: Normocephalic and atraumatic.  Right Ear: External ear normal.  Left Ear: External ear normal.  Mouth/Throat: Oropharynx is clear and moist. No oropharyngeal exudate.  Eyes: Conjunctivae and EOM are normal. Pupils are equal, round, and reactive to light. Right eye exhibits no discharge. Left eye exhibits no discharge.  Neck: Neck supple. No JVD present. No thyromegaly present.  Cardiovascular: Normal rate, regular rhythm, normal heart sounds and intact distal pulses.   No murmur heard. Pulmonary/Chest: Breath sounds normal. No respiratory distress. She has no rales. She exhibits no tenderness.  Breasts normal female  Abdominal: Soft. Bowel sounds are normal. She exhibits no distension and no mass. There is no tenderness. There is no rebound and no guarding.   Genitourinary:  Deferred to [name]  Musculoskeletal: Normal range of motion.  Lymphadenopathy:    She has no cervical adenopathy.  Neurological: She is alert and oriented to person, place, and time. She has normal reflexes.  Skin: Skin is warm and dry. No rash noted. She is not diaphoretic.  Psychiatric: She has a normal mood and affect. Her behavior is normal. Judgment and thought content normal.  Vitals reviewed.         Assessment & Plan:  History of anxiety-now off sotalol can doing well  Prepregnancy counseling with [name] earlier today  Lab work is within normal limits including lipid panel  Plan: Return in 2 years or as needed and continue to see GYN annually.

## 2015-06-30 ENCOUNTER — Encounter (INDEPENDENT_AMBULATORY_CARE_PROVIDER_SITE_OTHER): Payer: Self-pay

## 2015-06-30 ENCOUNTER — Encounter: Payer: Self-pay | Admitting: Primary Care

## 2015-06-30 ENCOUNTER — Ambulatory Visit (INDEPENDENT_AMBULATORY_CARE_PROVIDER_SITE_OTHER): Payer: BLUE CROSS/BLUE SHIELD | Admitting: Primary Care

## 2015-06-30 VITALS — BP 116/70 | HR 84 | Temp 97.6°F | Ht 65.0 in | Wt 155.0 lb

## 2015-06-30 DIAGNOSIS — L503 Dermatographic urticaria: Secondary | ICD-10-CM

## 2015-06-30 DIAGNOSIS — E663 Overweight: Secondary | ICD-10-CM

## 2015-06-30 DIAGNOSIS — Z7189 Other specified counseling: Secondary | ICD-10-CM

## 2015-06-30 DIAGNOSIS — Z7689 Persons encountering health services in other specified circumstances: Secondary | ICD-10-CM

## 2015-06-30 NOTE — Progress Notes (Signed)
Subjective:    Patient ID: Traci Rice, female    DOB: 09/11/1980, 34 y.o.   MRN: [id number]  HPI  Traci Rice is a 34 year old female who presents today to establish care and discuss the problems mentioned below. Will obtain old records. Last physical was in October 2016.  1) GERD: History of in the past while in college, no recent episodes in the past 2-3 years.  2) Attention Deficit Disorder: History of during college. She was managed on Adderall 20 mg once daily while in school with significant improvement. She's been off of her medications since school ended 2-3 years ago.    3) Overweight: Currently managed on Phentermine 37.5 mg and will take 1/2 in the morning and 1/2 in the evening. She's been on this medication intermittently since June 2016. She stopped taking in September-October. She is seeing Traci Rice at the bariatric center in [place]. She's working to improve her diet and does not routinely exercise. BMI is 25.79 currently.  Review of Systems  Constitutional: Negative for unexpected weight change.  HENT: Negative for rhinorrhea.   Respiratory: Negative for cough and shortness of breath.   Cardiovascular: Negative for chest pain.  Gastrointestinal: Negative for diarrhea and constipation.  Genitourinary: Negative for difficulty urinating.       IUD removed, currently following with Traci Rice  Musculoskeletal: Negative for myalgias and arthralgias.  Skin: Negative for rash.  Neurological: Negative for dizziness, numbness and headaches.  Psychiatric/Behavioral:       Denies concerns for anxiety or depression       Past Medical History  Diagnosis Date  . Syncope   . PMS (premenstrual syndrome)   . Abnormal Pap smear   . Attention deficit disorder (ADD)   . GERD (gastroesophageal reflux disease)     Social History   Social History  . Marital Status: Single    Spouse Name: N/A  . Number of Children: N/A  . Years of Education: N/A   Occupational History    . Not on file.   Social History Main Topics  . Smoking status: Former Smoker    Types: Cigarettes    Quit date: 08/01/2012  . Smokeless tobacco: Never Used  . Alcohol Use: No     Comment: rarely, not while pregnant  . Drug Use: No  . Sexual Activity: Yes    Birth Control/ Protection: IUD   Other Topics Concern  . Not on file   Social History Narrative   Married.   1 child.   Works in [employer]ing.   Enjoys drinking wine, watching movies, shopping.     Past Surgical History  Procedure Laterality Date  . Leep    . Laporoscopy      remove cyst from left ovary  . Ovarian cyst removal      Family History  Problem Relation Age of Onset  . Diabetes Mother   . Heart disease Paternal Aunt   . Diabetes Maternal Grandmother   . Diabetes Paternal Grandmother   . Cancer Paternal Grandmother   . Stroke Paternal Grandfather   . Kidney failure Maternal Grandmother   . Heart murmur Brother     Allergies  Allergen Reactions  . Hydrocodone Other (See Comments)    Does not do well with cough syrup and shaky    No current outpatient prescriptions on file prior to visit.   No current facility-administered medications on file prior to visit.    BP 116/70 mmHg  Pulse 84  Temp(Src) 97.6 F (36.4 C) (Oral)  Ht  (1.651 m)  Wt 155 lb (70.308 kg)  BMI 25.79 kg/m2  SpO2 95%    Objective:   Physical Exam  Constitutional: She is oriented to person, place, and time. She appears well-nourished.  Cardiovascular: Normal rate and regular rhythm.   Pulmonary/Chest: Effort normal and breath sounds normal.  Neurological: She is alert and oriented to person, place, and time.  Skin: Skin is warm and dry.  Psychiatric: She has a normal mood and affect.          Assessment & Plan:

## 2015-06-30 NOTE — Patient Instructions (Addendum)
Follow up in mid to late October 2017 for repeat physical or sooner if needed.  It was a pleasure to meet you today! Please don't hesitate to call me with any questions. Welcome to [facility]!

## 2015-06-30 NOTE — Assessment & Plan Note (Signed)
History of during college 2-3 years ago. No mention of since.

## 2015-06-30 NOTE — Progress Notes (Signed)
Pre visit review using our clinic review tool, if applicable. No additional management support is needed unless otherwise documented below in the visit note. 

## 2015-06-30 NOTE — Assessment & Plan Note (Signed)
Currently managed on phentermine 37.5 mg tablets per the [facility] in [place] since June 2016. I personally believe she doesn't need this medication and encouraged her to follow a healthy diet with regular exercise. BMI is 25.79.

## 2015-08-10 NOTE — L&D Delivery Note (Addendum)
35 y.o. G2P1102 at 769w2d delivered a viable female infant in cephalic, LOA position. no nuchal cord. right anterior shoulder delivered with ease. 60 sec delayed cord clamping. Cord clamped x2 and cut. Placenta delivered spontaneously intact, with 3VC. Fundus firm on exam with massage and pitocin. Good hemostasis noted.  Laceration: superficial 2nd degree midline Suture: 3-0 Vicryl EBL: 300 Good hemostasis noted.  Mom and baby recovering in LDR.    Apgars: 9,9 Weight: pending, skin to skin, see delivery summary  Cord blood obtained: Yes   WALLACE, NOAH I, DO PGY-3 07/08/2016, 10:21 AM    OB FELLOW DELIVERY ATTESTATION  I was gloved and present for the delivery in its entirety, and I agree with the above resident's note.  Patient arrived in MAU and precipitously delivered a preterm infant. Infant was vigorously crying at delivery.   Jen MowElizabeth Felicidad Sugarman, DO OB Fellow

## 2015-12-23 ENCOUNTER — Ambulatory Visit (INDEPENDENT_AMBULATORY_CARE_PROVIDER_SITE_OTHER): Payer: BLUE CROSS/BLUE SHIELD | Admitting: Obstetrics and Gynecology

## 2015-12-23 ENCOUNTER — Encounter: Payer: Self-pay | Admitting: Obstetrics and Gynecology

## 2015-12-23 VITALS — BP 115/72 | HR 99 | Wt 163.0 lb

## 2015-12-23 DIAGNOSIS — Z113 Encounter for screening for infections with a predominantly sexual mode of transmission: Secondary | ICD-10-CM

## 2015-12-23 DIAGNOSIS — Z36 Encounter for antenatal screening of mother: Secondary | ICD-10-CM | POA: Diagnosis not present

## 2015-12-23 DIAGNOSIS — Z3481 Encounter for supervision of other normal pregnancy, first trimester: Secondary | ICD-10-CM

## 2015-12-23 DIAGNOSIS — Z3480 Encounter for supervision of other normal pregnancy, unspecified trimester: Secondary | ICD-10-CM | POA: Insufficient documentation

## 2015-12-23 NOTE — Progress Notes (Signed)
   Subjective:    Traci Rice is a G2P1001 1543w0d being seen today for her first obstetrical visit.  Her obstetrical history is significant for uncomplicated previous pregnancy. Patient does intend to provide breast milk by pumping but not nursing. Pregnancy history fully reviewed.  Patient reports nausea.  Filed Vitals:   12/23/15 1541  BP: 115/72  Pulse: 99  Weight: 163 lb (73.936 kg)    HISTORY: OB History  Gravida Para Term Preterm AB SAB TAB Ectopic Multiple Living  2 1 1       1     # Outcome Date GA Lbr Len/2nd Weight Sex Delivery Anes PTL Lv  2 Current           1 Term 04/16/13 4794w6d 09:09 / 00:54 7 lb 8.8 oz (3.425 kg) F Vag-Spont EPI  Y     Past Medical History  Diagnosis Date  . Syncope   . Overweight (BMI 25.0-29.9)   . Abnormal Pap smear   . Attention deficit disorder (ADD)   . GERD (gastroesophageal reflux disease)    Past Surgical History  Procedure Laterality Date  . Leep    . Laporoscopy      remove cyst from left ovary  . Ovarian cyst removal     Family History  Problem Relation Age of Onset  . Diabetes Mother   . Heart disease Paternal Aunt   . Diabetes Maternal Grandmother   . Diabetes Paternal Grandmother   . Cancer Paternal Grandmother   . Stroke Paternal Grandfather   . Kidney failure Maternal Grandmother   . Heart murmur Brother      Exam    Uterus:     Pelvic Exam:    Perineum: Normal Perineum   Vulva: normal   Vagina:  normal mucosa, normal discharge   pH:    Cervix: multiparous appearance closed and long    Adnexa: no mass, fullness, tenderness   Bony Pelvis: gynecoid  System: Breast:  normal appearance, no masses or tenderness   Skin: normal coloration and turgor, no rashes    Neurologic: normal, no focal deficits   Extremities: normal strength, tone, and muscle mass   HEENT extra ocular movement intact   Mouth/Teeth mucous membranes moist, pharynx normal without lesions and dental hygiene good   Neck supple and no  masses   Cardiovascular: regular rate and rhythm   Respiratory:  chest clear, no wheezing, crepitations, rhonchi, normal symmetric air entry   Abdomen: soft, non-tender; bowel sounds normal; no masses,  no organomegaly   Urinary:       Assessment:    Pregnancy: G2P1001 Patient Active Problem List   Diagnosis Date Noted  . Supervision of other normal pregnancy, antepartum 12/23/2015  . Overweight (BMI 25.0-29.9) 06/30/2015  . Urticaria 10/25/2011  . 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 Prenatal vitamins. Problem list reviewed and updated. Genetic Screening discussed First Screen: declined.  Ultrasound discussed; fetal survey: requested.  Follow up in 4 weeks. 50% of 30 min visit spent on counseling and coordination of care.     Keltin Baird 12/23/2015

## 2015-12-23 NOTE — Progress Notes (Signed)
Patient with 4+ glucose. She denies h/o GDM. She reports consuming 2 chocolate bars and a sprite prior to coming to this appointment. Monitor closely at subsequent visits for possible early glucola

## 2015-12-23 NOTE — Patient Instructions (Signed)
First Trimester of Pregnancy The first trimester of pregnancy is from week 1 until the end of week 12 (months 1 through 3). A week after a sperm fertilizes an egg, the egg will implant on the wall of the uterus. This embryo will begin to develop into a baby. Genes from you and your partner are forming the baby. The female genes determine whether the baby is a boy or a girl. At 6-8 weeks, the eyes and face are formed, and the heartbeat can be seen on ultrasound. At the end of 12 weeks, all the baby's organs are formed.  Now that you are pregnant, you will want to do everything you can to have a healthy baby. Two of the most important things are to get good prenatal care and to follow your health care provider's instructions. Prenatal care is all the medical care you receive before the baby's birth. This care will help prevent, find, and treat any problems during the pregnancy and childbirth. BODY CHANGES Your body goes through many changes during pregnancy. The changes vary from woman to woman.   You may gain or lose a couple of pounds at first.  You may feel sick to your stomach (nauseous) and throw up (vomit). If the vomiting is uncontrollable, call your health care provider.  You may tire easily.  You may develop headaches that can be relieved by medicines approved by your health care provider.  You may urinate more often. Painful urination may mean you have a bladder infection.  You may develop heartburn as a result of your pregnancy.  You may develop constipation because certain hormones are causing the muscles that push waste through your intestines to slow down.  You may develop hemorrhoids or swollen, bulging veins (varicose veins).  Your breasts may begin to grow larger and become tender. Your nipples may stick out more, and the tissue that surrounds them (areola) may become darker.  Your gums may bleed and may be sensitive to brushing and flossing.  Dark spots or blotches  (chloasma, mask of pregnancy) may develop on your face. This will likely fade after the baby is born.  Your menstrual periods will stop.  You may have a loss of appetite.  You may develop cravings for certain kinds of food.  You may have changes in your emotions from day to day, such as being excited to be pregnant or being concerned that something may go wrong with the pregnancy and baby.  You may have more vivid and strange dreams.  You may have changes in your hair. These can include thickening of your hair, rapid growth, and changes in texture. Some women also have hair loss during or after pregnancy, or hair that feels dry or thin. Your hair will most likely return to normal after your baby is born. WHAT TO EXPECT AT YOUR PRENATAL VISITS During a routine prenatal visit:  You will be weighed to make sure you and the baby are growing normally.  Your blood pressure will be taken.  Your abdomen will be measured to track your baby's growth.  The fetal heartbeat will be listened to starting around week 10 or 12 of your pregnancy.  Test results from any previous visits will be discussed. Your health care provider may ask you:  How you are feeling.  If you are feeling the baby move.  If you have had any abnormal symptoms, such as leaking fluid, bleeding, severe headaches, or abdominal cramping.  If you are using any tobacco products,   including cigarettes, chewing tobacco, and electronic cigarettes.  If you have any questions. Other tests that may be performed during your first trimester include:  Blood tests to find your blood type and to check for the presence of any previous infections. They will also be used to check for low iron levels (anemia) and Rh antibodies. Later in the pregnancy, blood tests for diabetes will be done along with other tests if problems develop.  Urine tests to check for infections, diabetes, or protein in the urine.  An ultrasound to confirm the  proper growth and development of the baby.  An amniocentesis to check for possible genetic problems.  Fetal screens for spina bifida and Down syndrome.  You may need other tests to make sure you and the baby are doing well.  HIV (human immunodeficiency virus) testing. Routine prenatal testing includes screening for HIV, unless you choose not to have this test. HOME CARE INSTRUCTIONS  Medicines  Follow your health care provider's instructions regarding medicine use. Specific medicines may be either safe or unsafe to take during pregnancy.  Take your prenatal vitamins as directed.  If you develop constipation, try taking a stool softener if your health care provider approves. Diet  Eat regular, well-balanced meals. Choose a variety of foods, such as meat or vegetable-based protein, fish, milk and low-fat dairy products, vegetables, fruits, and whole grain breads and cereals. Your health care provider will help you determine the amount of weight gain that is right for you.  Avoid raw meat and uncooked cheese. These carry germs that can cause birth defects in the baby.  Eating four or five small meals rather than three large meals a day may help relieve nausea and vomiting. If you start to feel nauseous, eating a few soda crackers can be helpful. Drinking liquids between meals instead of during meals also seems to help nausea and vomiting.  If you develop constipation, eat more high-fiber foods, such as fresh vegetables or fruit and whole grains. Drink enough fluids to keep your urine clear or pale yellow. Activity and Exercise  Exercise only as directed by your health care provider. Exercising will help you:  Control your weight.  Stay in shape.  Be prepared for labor and delivery.  Experiencing pain or cramping in the lower abdomen or low back is a good sign that you should stop exercising. Check with your health care provider before continuing normal exercises.  Try to avoid  standing for long periods of time. Move your legs often if you must stand in one place for a long time.  Avoid heavy lifting.  Wear low-heeled shoes, and practice good posture.  You may continue to have sex unless your health care provider directs you otherwise. Relief of Pain or Discomfort  Wear a good support bra for breast tenderness.   Take warm sitz baths to soothe any pain or discomfort caused by hemorrhoids. Use hemorrhoid cream if your health care provider approves.   Rest with your legs elevated if you have leg cramps or low back pain.  If you develop varicose veins in your legs, wear support hose. Elevate your feet for 15 minutes, 3-4 times a day. Limit salt in your diet. Prenatal Care  Schedule your prenatal visits by the twelfth week of pregnancy. They are usually scheduled monthly at first, then more often in the last 2 months before delivery.  Write down your questions. Take them to your prenatal visits.  Keep all your prenatal visits as directed by your   health care provider. Safety  Wear your seat belt at all times when driving.  Make a list of emergency phone numbers, including numbers for family, friends, the hospital, and police and fire departments. General Tips  Ask your health care provider for a referral to a local prenatal education class. Begin classes no later than at the beginning of month 6 of your pregnancy.  Ask for help if you have counseling or nutritional needs during pregnancy. Your health care provider can offer advice or refer you to specialists for help with various needs.  Do not use hot tubs, steam rooms, or saunas.  Do not douche or use tampons or scented sanitary pads.  Do not cross your legs for long periods of time.  Avoid cat litter boxes and soil used by cats. These carry germs that can cause birth defects in the baby and possibly loss of the fetus by miscarriage or stillbirth.  Avoid all smoking, herbs, alcohol, and medicines  not prescribed by your health care provider. Chemicals in these affect the formation and growth of the baby.  Do not use any tobacco products, including cigarettes, chewing tobacco, and electronic cigarettes. If you need help quitting, ask your health care provider. You may receive counseling support and other resources to help you quit.  Schedule a dentist appointment. At home, brush your teeth with a soft toothbrush and be gentle when you floss. SEEK MEDICAL CARE IF:   You have dizziness.  You have mild pelvic cramps, pelvic pressure, or nagging pain in the abdominal area.  You have persistent nausea, vomiting, or diarrhea.  You have a bad smelling vaginal discharge.  You have pain with urination.  You notice increased swelling in your face, hands, legs, or ankles. SEEK IMMEDIATE MEDICAL CARE IF:   You have a fever.  You are leaking fluid from your vagina.  You have spotting or bleeding from your vagina.  You have severe abdominal cramping or pain.  You have rapid weight gain or loss.  You vomit blood or material that looks like coffee grounds.  You are exposed to German measles and have never had them.  You are exposed to fifth disease or chickenpox.  You develop a severe headache.  You have shortness of breath.  You have any kind of trauma, such as from a fall or a car accident.   This information is not intended to replace advice given to you by your health care provider. Make sure you discuss any questions you have with your health care provider.   Document Released: 07/20/2001 Document Revised: 08/16/2014 Document Reviewed: 06/05/2013 Elsevier Interactive Patient Education 2016 [employer]  Contraception Choices Contraception (birth control) is the use of any methods or devices to prevent pregnancy. Below are some methods to help avoid pregnancy. HORMONAL METHODS   Contraceptive implant. This is a thin, plastic tube containing progesterone hormone. It does  not contain estrogen hormone. Your health care provider inserts the tube in the inner part of the upper arm. The tube can remain in place for up to 3 years. After 3 years, the implant must be removed. The implant prevents the ovaries from releasing an egg (ovulation), thickens the cervical mucus to prevent sperm from entering the uterus, and thins the lining of the inside of the uterus.  Progesterone-only injections. These injections are given every 3 months by your health care provider to prevent pregnancy. This synthetic progesterone hormone stops the ovaries from releasing eggs. It also thickens cervical mucus and changes the   uterine lining. This makes it harder for sperm to survive in the uterus.  Birth control pills. These pills contain estrogen and progesterone hormone. They work by preventing the ovaries from releasing eggs (ovulation). They also cause the cervical mucus to thicken, preventing the sperm from entering the uterus. Birth control pills are prescribed by a health care provider.Birth control pills can also be used to treat heavy periods.  Minipill. This type of birth control pill contains only the progesterone hormone. They are taken every day of each month and must be prescribed by your health care provider.  Birth control patch. The patch contains hormones similar to those in birth control pills. It must be changed once a week and is prescribed by a health care provider.  Vaginal ring. The ring contains hormones similar to those in birth control pills. It is left in the vagina for 3 weeks, removed for 1 week, and then a new one is put back in place. The patient must be comfortable inserting and removing the ring from the vagina.A health care provider's prescription is necessary.  Emergency contraception. Emergency contraceptives prevent pregnancy after unprotected sexual intercourse. This pill can be taken right after sex or up to 5 days after unprotected sex. It is most effective  the sooner you take the pills after having sexual intercourse. Most emergency contraceptive pills are available without a prescription. Check with your pharmacist. Do not use emergency contraception as your only form of birth control. BARRIER METHODS   Female condom. This is a thin sheath (latex or rubber) that is worn over the penis during sexual intercourse. It can be used with spermicide to increase effectiveness.  Female condom. This is a soft, loose-fitting sheath that is put into the vagina before sexual intercourse.  Diaphragm. This is a soft, latex, dome-shaped barrier that must be fitted by a health care provider. It is inserted into the vagina, along with a spermicidal jelly. It is inserted before intercourse. The diaphragm should be left in the vagina for 6 to 8 hours after intercourse.  Cervical cap. This is a round, soft, latex or plastic cup that fits over the cervix and must be fitted by a health care provider. The cap can be left in place for up to 48 hours after intercourse.  Sponge. This is a soft, circular piece of polyurethane foam. The sponge has spermicide in it. It is inserted into the vagina after wetting it and before sexual intercourse.  Spermicides. These are chemicals that kill or block sperm from entering the cervix and uterus. They come in the form of creams, jellies, suppositories, foam, or tablets. They do not require a prescription. They are inserted into the vagina with an applicator before having sexual intercourse. The process must be repeated every time you have sexual intercourse. INTRAUTERINE CONTRACEPTION  Intrauterine device (IUD). This is a T-shaped device that is put in a woman's uterus during a menstrual period to prevent pregnancy. There are 2 types:  Copper IUD. This type of IUD is wrapped in copper wire and is placed inside the uterus. Copper makes the uterus and fallopian tubes produce a fluid that kills sperm. It can stay in place for 10  years.  Hormone IUD. This type of IUD contains the hormone progestin (synthetic progesterone). The hormone thickens the cervical mucus and prevents sperm from entering the uterus, and it also thins the uterine lining to prevent implantation of a fertilized egg. The hormone can weaken or kill the sperm that get into the   uterus. It can stay in place for 3-5 years, depending on which type of IUD is used. PERMANENT METHODS OF CONTRACEPTION  Female tubal ligation. This is when the woman's fallopian tubes are surgically sealed, tied, or blocked to prevent the egg from traveling to the uterus.  Hysteroscopic sterilization. This involves placing a small coil or insert into each fallopian tube. Your doctor uses a technique called hysteroscopy to do the procedure. The device causes scar tissue to form. This results in permanent blockage of the fallopian tubes, so the sperm cannot fertilize the egg. It takes about 3 months after the procedure for the tubes to become blocked. You must use another form of birth control for these 3 months.  Female sterilization. This is when the female has the tubes that carry sperm tied off (vasectomy).This blocks sperm from entering the vagina during sexual intercourse. After the procedure, the man can still ejaculate fluid (semen). NATURAL PLANNING METHODS  Natural family planning. This is not having sexual intercourse or using a barrier method (condom, diaphragm, cervical cap) on days the woman could become pregnant.  Calendar method. This is keeping track of the length of each menstrual cycle and identifying when you are fertile.  Ovulation method. This is avoiding sexual intercourse during ovulation.  Symptothermal method. This is avoiding sexual intercourse during ovulation, using a thermometer and ovulation symptoms.  Post-ovulation method. This is timing sexual intercourse after you have ovulated. Regardless of which type or method of contraception you choose, it is  important that you use condoms to protect against the transmission of sexually transmitted infections (STIs). Talk with your health care provider about which form of contraception is most appropriate for you.   This information is not intended to replace advice given to you by your health care provider. Make sure you discuss any questions you have with your health care provider.   Document Released: 07/26/2005 Document Revised: 07/31/2013 Document Reviewed: 01/18/2013 Elsevier Interactive Patient Education 2016 [employer]  Breastfeeding Deciding to breastfeed is one of the best choices you can make for you and your baby. A change in hormones during pregnancy causes your breast tissue to grow and increases the number and size of your milk ducts. These hormones also allow proteins, sugars, and fats from your blood supply to make breast milk in your milk-producing glands. Hormones prevent breast milk from being released before your baby is born as well as prompt milk flow after birth. Once breastfeeding has begun, thoughts of your baby, as well as his or her sucking or crying, can stimulate the release of milk from your milk-producing glands.  BENEFITS OF BREASTFEEDING For Your Baby  Your first milk (colostrum) helps your baby's digestive system function better.  There are antibodies in your milk that help your baby fight off infections.  Your baby has a lower incidence of asthma, allergies, and sudden infant death syndrome.  The nutrients in breast milk are better for your baby than infant formulas and are designed uniquely for your baby's needs.  Breast milk improves your baby's brain development.  Your baby is less likely to develop other conditions, such as childhood obesity, asthma, or type 2 diabetes mellitus. For You  Breastfeeding helps to create a very special bond between you and your baby.  Breastfeeding is convenient. Breast milk is always available at the correct temperature  and costs nothing.  Breastfeeding helps to burn calories and helps you lose the weight gained during pregnancy.  Breastfeeding makes your uterus contract to its   prepregnancy size faster and slows bleeding (lochia) after you give birth.   Breastfeeding helps to lower your risk of developing type 2 diabetes mellitus, osteoporosis, and breast or ovarian cancer later in life. SIGNS THAT YOUR BABY IS HUNGRY Early Signs of Hunger  Increased alertness or activity.  Stretching.  Movement of the head from side to side.  Movement of the head and opening of the mouth when the corner of the mouth or cheek is stroked (rooting).  Increased sucking sounds, smacking lips, cooing, sighing, or squeaking.  Hand-to-mouth movements.  Increased sucking of fingers or hands. Late Signs of Hunger  Fussing.  Intermittent crying. Extreme Signs of Hunger Signs of extreme hunger will require calming and consoling before your baby will be able to breastfeed successfully. Do not wait for the following signs of extreme hunger to occur before you initiate breastfeeding:  Restlessness.  A loud, strong cry.  Screaming. BREASTFEEDING BASICS Breastfeeding Initiation  Find a comfortable place to sit or lie down, with your neck and back well supported.  Place a pillow or rolled up blanket under your baby to bring him or her to the level of your breast (if you are seated). Nursing pillows are specially designed to help support your arms and your baby while you breastfeed.  Make sure that your baby's abdomen is facing your abdomen.  Gently massage your breast. With your fingertips, massage from your chest wall toward your nipple in a circular motion. This encourages milk flow. You may need to continue this action during the feeding if your milk flows slowly.  Support your breast with 4 fingers underneath and your thumb above your nipple. Make sure your fingers are well away from your nipple and your baby's  mouth.  Stroke your baby's lips gently with your finger or nipple.  When your baby's mouth is open wide enough, quickly bring your baby to your breast, placing your entire nipple and as much of the colored area around your nipple (areola) as possible into your baby's mouth.  More areola should be visible above your baby's upper lip than below the lower lip.  Your baby's tongue should be between his or her lower gum and your breast.  Ensure that your baby's mouth is correctly positioned around your nipple (latched). Your baby's lips should create a seal on your breast and be turned out (everted).  It is common for your baby to suck about 2-3 minutes in order to start the flow of breast milk. Latching Teaching your baby how to latch on to your breast properly is very important. An improper latch can cause nipple pain and decreased milk supply for you and poor weight gain in your baby. Also, if your baby is not latched onto your nipple properly, he or she may swallow some air during feeding. This can make your baby fussy. Burping your baby when you switch breasts during the feeding can help to get rid of the air. However, teaching your baby to latch on properly is still the best way to prevent fussiness from swallowing air while breastfeeding. Signs that your baby has successfully latched on to your nipple:  Silent tugging or silent sucking, without causing you pain.  Swallowing heard between every 3-4 sucks.  Muscle movement above and in front of his or her ears while sucking. Signs that your baby has not successfully latched on to nipple:  Sucking sounds or smacking sounds from your baby while breastfeeding.  Nipple pain. If you think your baby has   not latched on correctly, slip your finger into the corner of your baby's mouth to break the suction and place it between your baby's gums. Attempt breastfeeding initiation again. Signs of Successful Breastfeeding Signs from your baby:  A  gradual decrease in the number of sucks or complete cessation of sucking.  Falling asleep.  Relaxation of his or her body.  Retention of a small amount of milk in his or her mouth.  Letting go of your breast by himself or herself. Signs from you:  Breasts that have increased in firmness, weight, and size 1-3 hours after feeding.  Breasts that are softer immediately after breastfeeding.  Increased milk volume, as well as a change in milk consistency and color by the fifth day of breastfeeding.  Nipples that are not sore, cracked, or bleeding. Signs That Your Baby is Getting Enough Milk  Wetting at least 3 diapers in a 24-hour period. The urine should be clear and pale yellow by age 5 days.  At least 3 stools in a 24-hour period by age 5 days. The stool should be soft and yellow.  At least 3 stools in a 24-hour period by age 7 days. The stool should be seedy and yellow.  No loss of weight greater than 10% of birth weight during the first 3 days of age.  Average weight gain of 4-7 ounces (113-198 g) per week after age 4 days.  Consistent daily weight gain by age 5 days, without weight loss after the age of 2 weeks. After a feeding, your baby may spit up a small amount. This is common. BREASTFEEDING FREQUENCY AND DURATION Frequent feeding will help you make more milk and can prevent sore nipples and breast engorgement. Breastfeed when you feel the need to reduce the fullness of your breasts or when your baby shows signs of hunger. This is called "breastfeeding on demand." Avoid introducing a pacifier to your baby while you are working to establish breastfeeding (the first 4-6 weeks after your baby is born). After this time you may choose to use a pacifier. Research has shown that pacifier use during the first year of a baby's life decreases the risk of sudden infant death syndrome (SIDS). Allow your baby to feed on each breast as long as he or she wants. Breastfeed until your baby is  finished feeding. When your baby unlatches or falls asleep while feeding from the first breast, offer the second breast. Because newborns are often sleepy in the first few weeks of life, you may need to awaken your baby to get him or her to feed. Breastfeeding times will vary from baby to baby. However, the following rules can serve as a guide to help you ensure that your baby is properly fed:  Newborns (babies 4 weeks of age or younger) may breastfeed every 1-3 hours.  Newborns should not go longer than 3 hours during the day or 5 hours during the night without breastfeeding.  You should breastfeed your baby a minimum of 8 times in a 24-hour period until you begin to introduce solid foods to your baby at around 6 months of age. BREAST MILK PUMPING Pumping and storing breast milk allows you to ensure that your baby is exclusively fed your breast milk, even at times when you are unable to breastfeed. This is especially important if you are going back to work while you are still breastfeeding or when you are not able to be present during feedings. Your lactation consultant can give you guidelines on   how long it is safe to store breast milk. A breast pump is a machine that allows you to pump milk from your breast into a sterile bottle. The pumped breast milk can then be stored in a refrigerator or freezer. Some breast pumps are operated by hand, while others use electricity. Ask your lactation consultant which type will work best for you. Breast pumps can be purchased, but some hospitals and breastfeeding support groups lease breast pumps on a monthly basis. A lactation consultant can teach you how to hand express breast milk, if you prefer not to use a pump. CARING FOR YOUR BREASTS WHILE YOU BREASTFEED Nipples can become dry, cracked, and sore while breastfeeding. The following recommendations can help keep your breasts moisturized and healthy:  Avoid using soap on your nipples.  Wear a supportive bra.  Although not required, special nursing bras and tank tops are designed to allow access to your breasts for breastfeeding without taking off your entire bra or top. Avoid wearing underwire-style bras or extremely tight bras.  Air dry your nipples for 3-4minutes after each feeding.  Use only cotton bra pads to absorb leaked breast milk. Leaking of breast milk between feedings is normal.  Use lanolin on your nipples after breastfeeding. Lanolin helps to maintain your skin's normal moisture barrier. If you use pure lanolin, you do not need to wash it off before feeding your baby again. Pure lanolin is not toxic to your baby. You may also hand express a few drops of breast milk and gently massage that milk into your nipples and allow the milk to air dry. In the first few weeks after giving birth, some women experience extremely full breasts (engorgement). Engorgement can make your breasts feel heavy, warm, and tender to the touch. Engorgement peaks within 3-5 days after you give birth. The following recommendations can help ease engorgement:  Completely empty your breasts while breastfeeding or pumping. You may want to start by applying warm, moist heat (in the shower or with warm water-soaked hand towels) just before feeding or pumping. This increases circulation and helps the milk flow. If your baby does not completely empty your breasts while breastfeeding, pump any extra milk after he or she is finished.  Wear a snug bra (nursing or regular) or tank top for 1-2 days to signal your body to slightly decrease milk production.  Apply ice packs to your breasts, unless this is too uncomfortable for you.  Make sure that your baby is latched on and positioned properly while breastfeeding. If engorgement persists after 48 hours of following these recommendations, contact your health care provider or a lactation consultant. OVERALL HEALTH CARE RECOMMENDATIONS WHILE BREASTFEEDING  Eat healthy foods.  Alternate between meals and snacks, eating 3 of each per day. Because what you eat affects your breast milk, some of the foods may make your baby more irritable than usual. Avoid eating these foods if you are sure that they are negatively affecting your baby.  Drink milk, fruit juice, and water to satisfy your thirst (about 10 glasses a day).  Rest often, relax, and continue to take your prenatal vitamins to prevent fatigue, stress, and anemia.  Continue breast self-awareness checks.  Avoid chewing and smoking tobacco. Chemicals from cigarettes that pass into breast milk and exposure to secondhand smoke may harm your baby.  Avoid alcohol and drug use, including marijuana. Some medicines that may be harmful to your baby can pass through breast milk. It is important to ask your health care provider   before taking any medicine, including all over-the-counter and prescription medicine as well as vitamin and herbal supplements. It is possible to become pregnant while breastfeeding. If birth control is desired, ask your health care provider about options that will be safe for your baby. SEEK MEDICAL CARE IF:  You feel like you want to stop breastfeeding or have become frustrated with breastfeeding.  You have painful breasts or nipples.  Your nipples are cracked or bleeding.  Your breasts are red, tender, or warm.  You have a swollen area on either breast.  You have a fever or chills.  You have nausea or vomiting.  You have drainage other than breast milk from your nipples.  Your breasts do not become full before feedings by the fifth day after you give birth.  You feel sad and depressed.  Your baby is too sleepy to eat well.  Your baby is having trouble sleeping.   Your baby is wetting less than 3 diapers in a 24-hour period.  Your baby has less than 3 stools in a 24-hour period.  Your baby's skin or the white part of his or her eyes becomes yellow.   Your baby is not gaining  weight by 5 days of age. SEEK IMMEDIATE MEDICAL CARE IF:  Your baby is overly tired (lethargic) and does not want to wake up and feed.  Your baby develops an unexplained fever.   This information is not intended to replace advice given to you by your health care provider. Make sure you discuss any questions you have with your health care provider.   Document Released: 07/26/2005 Document Revised: 04/16/2015 Document Reviewed: 01/17/2013 Elsevier Interactive Patient Education 2016 Elsevier Inc.  

## 2015-12-23 NOTE — Progress Notes (Signed)
Bedside US shows SIUP with FHR 170 and CRL 363w0d

## 2015-12-24 LAB — PRENATAL PROFILE (SOLSTAS)
ANTIBODY SCREEN: NEGATIVE
Basophils Absolute: 0 cells/uL (ref 0–200)
Basophils Relative: 0 %
EOS PCT: 1 %
Eosinophils Absolute: 128 cells/uL (ref 15–500)
HEMATOCRIT: 37.1 % (ref 35.0–45.0)
HEMOGLOBIN: 12.6 g/dL (ref 11.7–15.5)
HEP B S AG: NEGATIVE
HIV 1&2 Ab, 4th Generation: NONREACTIVE
LYMPHS ABS: 2176 {cells}/uL (ref 850–3900)
Lymphocytes Relative: 17 %
MCH: 30.4 pg (ref 27.0–33.0)
MCHC: 34 g/dL (ref 32.0–36.0)
MCV: 89.6 fL (ref 80.0–100.0)
MONOS PCT: 11 %
MPV: 10.7 fL (ref 7.5–12.5)
Monocytes Absolute: 1408 cells/uL — ABNORMAL HIGH (ref 200–950)
NEUTROS ABS: 9088 {cells}/uL — AB (ref 1500–7800)
NEUTROS PCT: 71 %
Platelets: 280 10*3/uL (ref 140–400)
RBC: 4.14 MIL/uL (ref 3.80–5.10)
RDW: 12.8 % (ref 11.0–15.0)
RUBELLA: 1.6 {index} — AB (ref <0.90)
Rh Type: POSITIVE
WBC: 12.8 10*3/uL — AB (ref 3.8–10.8)

## 2015-12-25 LAB — CULTURE, OB URINE
COLONY COUNT: NO GROWTH
Organism ID, Bacteria: NO GROWTH

## 2015-12-25 LAB — GC/CHLAMYDIA PROBE AMP (~~LOC~~) NOT AT ARMC
Chlamydia: NEGATIVE
NEISSERIA GONORRHEA: NEGATIVE

## 2016-01-20 ENCOUNTER — Ambulatory Visit (INDEPENDENT_AMBULATORY_CARE_PROVIDER_SITE_OTHER): Payer: BLUE CROSS/BLUE SHIELD | Admitting: Obstetrics & Gynecology

## 2016-01-20 VITALS — BP 103/67 | HR 87 | Wt 166.0 lb

## 2016-01-20 DIAGNOSIS — Z3689 Encounter for other specified antenatal screening: Secondary | ICD-10-CM

## 2016-01-20 DIAGNOSIS — Z36 Encounter for antenatal screening of mother: Secondary | ICD-10-CM

## 2016-01-20 DIAGNOSIS — Z3481 Encounter for supervision of other normal pregnancy, first trimester: Secondary | ICD-10-CM

## 2016-01-20 NOTE — Patient Instructions (Signed)

## 2016-01-20 NOTE — Progress Notes (Signed)
Subjective:  Traci Rice is a 35 y.o. G2P1001 at 2636w0d being seen today for ongoing prenatal care.  She is currently monitored for the following issues for this low-risk pregnancy and has Urticaria; Dermatographia; Overweight (BMI 25.0-29.9); and Supervision of other normal pregnancy, antepartum on her problem list.  Patient reports no complaints.  Contractions: Not present. Vag. Bleeding: None.  Movement: Absent. Denies leaking of fluid.  Desires to be in [facility].  The following portions of the patient's history were reviewed and updated as appropriate: allergies, current medications, past family history, past medical history, past social history, past surgical history and problem list. Problem list updated.  Objective:   Filed Vitals:   01/20/16 1546  BP: 103/67  Pulse: 87  Weight: 166 lb (75.297 kg)    Fetal Status: Fetal Heart Rate (bpm): 172   Movement: Absent     General:  Alert, oriented and cooperative. Patient is in no acute distress.  Skin: Skin is warm and dry. No rash noted.   Cardiovascular: Normal heart rate noted  Respiratory: Normal respiratory effort, no problems with respiration noted  Abdomen: Soft, gravid, appropriate for gestational age. Pain/Pressure: Absent     Pelvic: Cervical exam deferred        Extremities: Normal range of motion.  Edema: None  Mental Status: Normal mood and affect. Normal behavior. Normal judgment and thought content.   Urinalysis: Urine Protein: Negative Urine Glucose: 1+  Assessment and Plan:  Pregnancy: G2P1001 at 8336w0d  1. Supervision of other normal pregnancy, antepartum, first trimester 2. Encounter for fetal anatomic survey Enrolled in [facility] today, declines genetic screening, anatomy ultrasound ordered - US MFM OB COMP + 14 WK; Future Routine obstetric precautions reviewed. Please refer to After Visit Summary for other counseling recommendations.  Return in about 8 weeks (around 03/16/2016) for 20 week  [facility] OB visit.   Tereso NewcomerUgonna A Sayid Moll, MD

## 2016-02-09 ENCOUNTER — Telehealth: Payer: Self-pay

## 2016-02-09 NOTE — Telephone Encounter (Signed)
Called patient to let her know that August 2017 MD schedule was available and she needed to make her 20wk OB appt, called patient no answer, left voice mail, instructing patient to return my call here at the office.

## 2016-02-25 ENCOUNTER — Encounter (HOSPITAL_COMMUNITY): Payer: Self-pay | Admitting: Obstetrics & Gynecology

## 2016-03-01 ENCOUNTER — Ambulatory Visit (INDEPENDENT_AMBULATORY_CARE_PROVIDER_SITE_OTHER): Payer: Managed Care, Other (non HMO) | Admitting: Primary Care

## 2016-03-01 VITALS — BP 114/68 | HR 96 | Temp 98.2°F | Ht 65.0 in | Wt 170.0 lb

## 2016-03-01 DIAGNOSIS — R059 Cough, unspecified: Secondary | ICD-10-CM

## 2016-03-01 DIAGNOSIS — J069 Acute upper respiratory infection, unspecified: Secondary | ICD-10-CM | POA: Diagnosis not present

## 2016-03-01 DIAGNOSIS — R05 Cough: Secondary | ICD-10-CM

## 2016-03-01 MED ORDER — AMOXICILLIN 875 MG PO TABS: 875.0000 mg | ORAL_TABLET | Freq: Two times a day (BID) | ORAL | 0 refills | Status: AC

## 2016-03-01 NOTE — Patient Instructions (Signed)
Start amoxicillin antibiotics. Take 1 tablet by mouth twice daily for 7 days.  Cough/Congestion: Try taking Mucinex DM. This will help loosen up the mucous in your chest. Ensure you take this medication with a full glass of water. This is safe to take during pregnancy.  Please notify me if no improvement in 3-4 days.  It was a pleasure to see you today!   Upper Respiratory Infection, Adult Most upper respiratory infections (URIs) are a viral infection of the air passages leading to the lungs. A URI affects the nose, throat, and upper air passages. The most common type of URI is nasopharyngitis and is typically referred to as "the common cold." URIs run their course and usually go away on their own. Most of the time, a URI does not require medical attention, but sometimes a bacterial infection in the upper airways can follow a viral infection. This is called a secondary infection. Sinus and middle ear infections are common types of secondary upper respiratory infections. Bacterial pneumonia can also complicate a URI. A URI can worsen asthma and chronic obstructive pulmonary disease (COPD). Sometimes, these complications can require emergency medical care and may be life threatening.  CAUSES Almost all URIs are caused by viruses. A virus is a type of germ and can spread from one person to another.  RISKS FACTORS You may be at risk for a URI if:   You smoke.   You have chronic heart or lung disease.  You have a weakened defense (immune) system.   You are very young or very old.   You have nasal allergies or asthma.  You work in crowded or poorly ventilated areas.  You work in health care facilities or schools. SIGNS AND SYMPTOMS  Symptoms typically develop 2-3 days after you come in contact with a cold virus. Most viral URIs last 7-10 days. However, viral URIs from the influenza virus (flu virus) can last 14-18 days and are typically more severe. Symptoms may include:   Runny or  stuffy (congested) nose.   Sneezing.   Cough.   Sore throat.   Headache.   Fatigue.   Fever.   Loss of appetite.   Pain in your forehead, behind your eyes, and over your cheekbones (sinus pain).  Muscle aches.  DIAGNOSIS  Your health care provider may diagnose a URI by:  Physical exam.  Tests to check that your symptoms are not due to another condition such as:  Strep throat.  Sinusitis.  Pneumonia.  Asthma. TREATMENT  A URI goes away on its own with time. It cannot be cured with medicines, but medicines may be prescribed or recommended to relieve symptoms. Medicines may help:  Reduce your fever.  Reduce your cough.  Relieve nasal congestion. HOME CARE INSTRUCTIONS   Take medicines only as directed by your health care provider.   Gargle warm saltwater or take cough drops to comfort your throat as directed by your health care provider.  Use a warm mist humidifier or inhale steam from a shower to increase air moisture. This may make it easier to breathe.  Drink enough fluid to keep your urine clear or pale yellow.   Eat soups and other clear broths and maintain good nutrition.   Rest as needed.   Return to work when your temperature has returned to normal or as your health care provider advises. You may need to stay home longer to avoid infecting others. You can also use a face mask and careful hand washing to prevent  spread of the virus.  Increase the usage of your inhaler if you have asthma.   Do not use any tobacco products, including cigarettes, chewing tobacco, or electronic cigarettes. If you need help quitting, ask your health care provider. PREVENTION  The best way to protect yourself from getting a cold is to practice good hygiene.   Avoid oral or hand contact with people with cold symptoms.   Wash your hands often if contact occurs.  There is no clear evidence that vitamin C, vitamin E, echinacea, or exercise reduces the chance  of developing a cold. However, it is always recommended to get plenty of rest, exercise, and practice good nutrition.  SEEK MEDICAL CARE IF:   You are getting worse rather than better.   Your symptoms are not controlled by medicine.   You have chills.  You have worsening shortness of breath.  You have brown or red mucus.  You have yellow or brown nasal discharge.  You have pain in your face, especially when you bend forward.  You have a fever.  You have swollen neck glands.  You have pain while swallowing.  You have white areas in the back of your throat. SEEK IMMEDIATE MEDICAL CARE IF:   You have severe or persistent:  Headache.  Ear pain.  Sinus pain.  Chest pain.  You have chronic lung disease and any of the following:  Wheezing.  Prolonged cough.  Coughing up blood.  A change in your usual mucus.  You have a stiff neck.  You have changes in your:  Vision.  Hearing.  Thinking.  Mood. MAKE SURE YOU:   Understand these instructions.  Will watch your condition.  Will get help right away if you are not doing well or get worse.   This information is not intended to replace advice given to you by your health care provider. Make sure you discuss any questions you have with your health care provider.   Document Released: 01/19/2001 Document Revised: 12/10/2014 Document Reviewed: 10/31/2013 Elsevier Interactive Patient Education [employer]

## 2016-03-01 NOTE — Progress Notes (Signed)
Pre visit review using our clinic review tool, if applicable. No additional management support is needed unless otherwise documented below in the visit note. 

## 2016-03-01 NOTE — Progress Notes (Signed)
Subjective:    Patient ID: Traci Rice, female    DOB: January 23, 1981, 35 y.o.   MRN: [id number]  HPI  Traci Rice is a 35 year old female who presents today with a chief complaint of cough. She also reports wheezing and shortness of breath. Her cough and shortness of breath have been present for the past 2 weeks, and the wheezing began this morning. Her cough has progressed over the past 1 week with increased fatigue and this past weekend she's felt progressively worse. Denies fevers, sore throat. She's taken tylenol without much improvement. She is [redacted] weeks pregnant.  Review of Systems  Constitutional: Positive for fatigue. Negative for chills and fever.  HENT: Negative for sinus pressure and sore throat.   Respiratory: Positive for cough, shortness of breath and wheezing.   Cardiovascular: Positive for chest pain.       Past Medical History:  Diagnosis Date  . Abnormal Pap smear   . Attention deficit disorder (ADD)   . GERD (gastroesophageal reflux disease)   . Overweight (BMI 25.0-29.9)   . Syncope      Social History   Social History  . Marital status: Single    Spouse name: N/A  . Number of children: N/A  . Years of education: N/A   Occupational History  . Not on file.   Social History Main Topics  . Smoking status: Former Smoker    Types: Cigarettes    Quit date: 08/01/2012  . Smokeless tobacco: Never Used  . Alcohol use No     Comment: rarely, not while pregnant  . Drug use: No  . Sexual activity: Yes    Birth control/ protection: IUD   Other Topics Concern  . Not on file   Social History Narrative   Married.   1 child.   Works in Audiological [employer].   Enjoys drinking wine, watching movies, shopping.     Past Surgical History:  Procedure Laterality Date  . laporoscopy     remove cyst from left ovary  . LEEP    . OVARIAN CYST REMOVAL      Family History  Problem Relation Age of Onset  . Diabetes Mother   . Heart disease Paternal Aunt   . Diabetes  Maternal Grandmother   . Diabetes Paternal Grandmother   . Cancer Paternal Grandmother   . Stroke Paternal Grandfather   . Kidney failure Maternal Grandmother   . Heart murmur Brother     Allergies  Allergen Reactions  . Hydrocodone Other (See Comments)    Does not do well with cough syrup and shaky    Current Outpatient Prescriptions on File Prior to Visit  Medication Sig Dispense Refill  . Prenatal Vit-Fe Fumarate-FA (MULTIVITAMIN-PRENATAL) 27-0.8 MG TABS tablet Take 1 tablet by mouth daily at 12 noon.     No current facility-administered medications on file prior to visit.     BP 114/68 (BP Location: Left Arm, Patient Position: Sitting, Cuff Size: Normal)   Pulse 96   Temp 98.2 F (36.8 C) (Oral)   Ht  (1.651 m)   Wt 170 lb (77.1 kg)   SpO2 98%   BMI 28.29 kg/m    Objective:   Physical Exam  Constitutional: She appears well-nourished. She appears ill.  HENT:  Right Ear: Tympanic membrane and ear canal normal.  Left Ear: Tympanic membrane and ear canal normal.  Nose: Right sinus exhibits no maxillary sinus tenderness and no frontal sinus tenderness. Left sinus exhibits no maxillary  sinus tenderness and no frontal sinus tenderness.  Mouth/Throat: Oropharynx is clear and moist.  Eyes: Conjunctivae are normal.  Neck: Neck supple.  Cardiovascular: Normal rate and regular rhythm.   Pulmonary/Chest: Effort normal. She has wheezes in the left upper field. She has rhonchi in the right upper field, the left upper field and the left lower field. She has no rales.  Lymphadenopathy:    She has no cervical adenopathy.  Skin: Skin is warm and dry.          Assessment & Plan:  Upper Respiratory Infection:  Cough and SOB x 2 weeks, much worse over the weekend. Wheezing started this morning. Overall no improvement. Exam today with mild wheezing to LUF; mild/moderate rhonchi throughout most lung fields, does appear acutely ill. Given duration of symptoms and  presentation, will treat for presumed bacterial involvement. Rx for Amoxicillin course sent to pharmacy. Also discussed use of Mucinex DM as it is safe for use in pregnancy. Push fluids and follow up PRN.  Morrie Sheldon, NP

## 2016-03-03 ENCOUNTER — Encounter: Payer: Self-pay | Admitting: Obstetrics and Gynecology

## 2016-03-03 ENCOUNTER — Ambulatory Visit (HOSPITAL_COMMUNITY)
Admission: RE | Admit: 2016-03-03 | Discharge: 2016-03-03 | Disposition: A | Discharge status: Home / Self Care | Payer: Managed Care, Other (non HMO) | Source: Ambulatory Visit | Attending: Obstetrics & Gynecology | Admitting: Obstetrics & Gynecology

## 2016-03-03 DIAGNOSIS — O99212 Obesity complicating pregnancy, second trimester: Secondary | ICD-10-CM | POA: Diagnosis not present

## 2016-03-03 DIAGNOSIS — Z3A18 18 weeks gestation of pregnancy: Secondary | ICD-10-CM | POA: Diagnosis not present

## 2016-03-03 DIAGNOSIS — Z3689 Encounter for other specified antenatal screening: Secondary | ICD-10-CM

## 2016-03-03 DIAGNOSIS — Z36 Encounter for antenatal screening of mother: Secondary | ICD-10-CM | POA: Insufficient documentation

## 2016-03-03 DIAGNOSIS — O4440 Low lying placenta NOS or without hemorrhage, unspecified trimester: Secondary | ICD-10-CM | POA: Insufficient documentation

## 2016-03-08 ENCOUNTER — Encounter: Payer: Self-pay | Admitting: *Deleted

## 2016-03-17 ENCOUNTER — Ambulatory Visit (INDEPENDENT_AMBULATORY_CARE_PROVIDER_SITE_OTHER): Payer: Managed Care, Other (non HMO) | Admitting: Family Medicine

## 2016-03-17 VITALS — BP 91/61 | HR 80 | Wt 176.0 lb

## 2016-03-17 DIAGNOSIS — O4440 Low lying placenta NOS or without hemorrhage, unspecified trimester: Secondary | ICD-10-CM

## 2016-03-17 DIAGNOSIS — E663 Overweight: Secondary | ICD-10-CM

## 2016-03-17 DIAGNOSIS — O4442 Low lying placenta NOS or without hemorrhage, second trimester: Secondary | ICD-10-CM

## 2016-03-17 DIAGNOSIS — Z3482 Encounter for supervision of other normal pregnancy, second trimester: Secondary | ICD-10-CM

## 2016-03-17 NOTE — Progress Notes (Signed)
Patient ID: Traci Rice, female   DOB: 07/08/1981, 35 y.o.   MRN: [id number] Subjective:  Traci Rice is a 35 y.o. G2P1001 at 2339w1d being seen today for ongoing prenatal care.  She is currently monitored for the following issues for this low-risk pregnancy and has Urticaria; Dermatographia; Overweight (BMI 25.0-29.9); Supervision of other normal pregnancy, antepartum; and Low lying placenta nos or without hemorrhage, unspecified trimester on her problem list.  Patient reports no complaints. Glucosuria noted on UA but POC CBG 77 (2hrs after last oral intake)  Contractions: Not present.  .  Movement: Present. Denies leaking of fluid.   The following portions of the patient's history were reviewed and updated as appropriate: allergies, current medications, past family history, past medical history, past social history, past surgical history and problem list. Problem list updated.  Objective:   Vitals:   03/17/16 1535  BP: 91/61  Pulse: 80  Weight: 176 lb (79.8 kg)    Fetal Status: Fetal Heart Rate (bpm): 147   Movement: Present     General:  Alert, oriented and cooperative. Patient is in no acute distress.  Skin: Skin is warm and dry. No rash noted.   Cardiovascular: Normal heart rate noted  Respiratory: Normal respiratory effort, no problems with respiration noted  Abdomen: Soft, gravid, appropriate for gestational age. Pain/Pressure: Absent     Pelvic:  Cervical exam deferred        Extremities: Normal range of motion.  Edema: None  Mental Status: Normal mood and affect. Normal behavior. Normal judgment and thought content.   Urinalysis: Urine Protein: Negative Urine Glucose: 3+  Assessment and Plan:  Pregnancy: G2P1001 at 5639w1d  1. Low lying placenta nos or without hemorrhage, unspecified trimester - will need repeat US around 30wk  2. Supervision of other normal pregnancy, antepartum, second trimester - UTD with prenatal care  3. Overweight (BMI 25.0-29.9) -weight  gain appropriate  Preterm labor symptoms and general obstetric precautions including but not limited to vaginal bleeding, contractions, leaking of fluid and fetal movement were reviewed in detail with the patient. Please refer to After Visit Summary for other counseling recommendations.   Return in about 8 weeks (around 05/12/2016) for for 28 wk visit, babyscripts.   Federico FlakeKimberly Niles Davidson Palmieri, MD

## 2016-03-17 NOTE — Progress Notes (Signed)
CBG 77 - LOI 1300

## 2016-03-17 NOTE — Patient Instructions (Signed)

## 2016-05-11 ENCOUNTER — Ambulatory Visit (INDEPENDENT_AMBULATORY_CARE_PROVIDER_SITE_OTHER): Payer: Managed Care, Other (non HMO) | Admitting: Obstetrics & Gynecology

## 2016-05-11 VITALS — BP 98/66 | HR 101 | Wt 188.0 lb

## 2016-05-11 DIAGNOSIS — Z3483 Encounter for supervision of other normal pregnancy, third trimester: Secondary | ICD-10-CM

## 2016-05-11 DIAGNOSIS — Z3480 Encounter for supervision of other normal pregnancy, unspecified trimester: Secondary | ICD-10-CM

## 2016-05-11 DIAGNOSIS — Z23 Encounter for immunization: Secondary | ICD-10-CM | POA: Diagnosis not present

## 2016-05-11 DIAGNOSIS — O4443 Low lying placenta NOS or without hemorrhage, third trimester: Secondary | ICD-10-CM

## 2016-05-11 DIAGNOSIS — O4440 Low lying placenta NOS or without hemorrhage, unspecified trimester: Secondary | ICD-10-CM

## 2016-05-11 LAB — CBC
HCT: 36.2 % (ref 35.0–45.0)
HEMOGLOBIN: 12.4 g/dL (ref 11.7–15.5)
MCH: 31.6 pg (ref 27.0–33.0)
MCHC: 34.3 g/dL (ref 32.0–36.0)
MCV: 92.1 fL (ref 80.0–100.0)
MPV: 10.9 fL (ref 7.5–12.5)
PLATELETS: 234 10*3/uL (ref 140–400)
RBC: 3.93 MIL/uL (ref 3.80–5.10)
RDW: 13.3 % (ref 11.0–15.0)
WBC: 11 10*3/uL — AB (ref 3.8–10.8)

## 2016-05-11 NOTE — Progress Notes (Signed)
   PRENATAL VISIT NOTE  Subjective:  Traci Rice is a 35 y.o. G2P1001 at 2819w0d being seen today for ongoing prenatal care.  She is currently monitored for the following issues for this low-risk pregnancy and has Urticaria; Dermatographia; Overweight (BMI 25.0-29.9); Supervision of other normal pregnancy, antepartum; and Low lying placenta nos or without hemorrhage, unspecified trimester on her problem list.  Patient reports no complaints.  Contractions: Irritability. Vag. Bleeding: None.  Movement: Present. Denies leaking of fluid.   The following portions of the patient's history were reviewed and updated as appropriate: allergies, current medications, past family history, past medical history, past social history, past surgical history and problem list. Problem list updated.  Objective:   Vitals:   05/11/16 1411  BP: 98/66  Pulse: (!) 101  Weight: 188 lb (85.3 kg)    Fetal Status: Fetal Heart Rate (bpm): 135 Fundal Height: 28 cm Movement: Present     General:  Alert, oriented and cooperative. Patient is in no acute distress.  Skin: Skin is warm and dry. No rash noted.   Cardiovascular: Normal heart rate noted  Respiratory: Normal respiratory effort, no problems with respiration noted  Abdomen: Soft, gravid, appropriate for gestational age. Pain/Pressure: Absent     Pelvic:  Cervical exam deferred        Extremities: Normal range of motion.  Edema: None  Mental Status: Normal mood and affect. Normal behavior. Normal judgment and thought content.   Urinalysis:      Assessment and Plan:  Pregnancy: G2P1001 at 519w0d  1. Low lying placenta nos or without hemorrhage, unspecified trimester Was 1.2 cm away during anatomy scan - US MFM OB FOLLOW UP; Future  2. Need for Tdap vaccination - Tdap vaccine greater than or equal to 7yo IM given  3. Encounter for supervision of other normal pregnancy in third trimester Third trimester labs today - CBC - HIV antibody - RPR -  Glucose Tolerance, 1 HR (50g) Preterm labor symptoms and general obstetric precautions including but not limited to vaginal bleeding, contractions, leaking of fluid and fetal movement were reviewed in detail with the patient. Please refer to After Visit Summary for other counseling recommendations.  Return in about 4 weeks (around 06/08/2016) for OB Visit (32 weeks Bab[facility]s).  Tereso NewcomerUgonna A Lindbergh Winkles, MD

## 2016-05-11 NOTE — Patient Instructions (Addendum)
Return to clinic for any scheduled appointments or obstetric concerns, or go to [facility] for evaluation    BAB[facility]CRIPTS PATIENT: [X]  initial, [X]  12, [x]  20, [x]  28, [ ]  32, [ ]  36, [ ]  38, [ ]  39, [ ]  40

## 2016-05-12 LAB — GLUCOSE TOLERANCE, 1 HOUR (50G) W/O FASTING: GLUCOSE, 1 HR, GESTATIONAL: 124 mg/dL (ref <140)

## 2016-05-12 LAB — HIV ANTIBODY (ROUTINE TESTING W REFLEX): HIV: NONREACTIVE

## 2016-05-13 LAB — RPR

## 2016-05-17 ENCOUNTER — Ambulatory Visit (HOSPITAL_COMMUNITY)
Admission: RE | Admit: 2016-05-17 | Discharge: 2016-05-17 | Disposition: A | Discharge status: Home / Self Care | Payer: Managed Care, Other (non HMO) | Source: Ambulatory Visit | Attending: Obstetrics & Gynecology | Admitting: Obstetrics & Gynecology

## 2016-05-17 ENCOUNTER — Other Ambulatory Visit: Payer: Self-pay | Admitting: Obstetrics & Gynecology

## 2016-05-17 DIAGNOSIS — Z362 Encounter for other antenatal screening follow-up: Secondary | ICD-10-CM | POA: Diagnosis present

## 2016-05-17 DIAGNOSIS — O4440 Low lying placenta NOS or without hemorrhage, unspecified trimester: Secondary | ICD-10-CM

## 2016-05-17 DIAGNOSIS — O4443 Low lying placenta NOS or without hemorrhage, third trimester: Secondary | ICD-10-CM | POA: Diagnosis not present

## 2016-05-17 DIAGNOSIS — Z3A28 28 weeks gestation of pregnancy: Secondary | ICD-10-CM | POA: Insufficient documentation

## 2016-06-08 ENCOUNTER — Ambulatory Visit (INDEPENDENT_AMBULATORY_CARE_PROVIDER_SITE_OTHER): Payer: Managed Care, Other (non HMO) | Admitting: Obstetrics & Gynecology

## 2016-06-08 DIAGNOSIS — Z3483 Encounter for supervision of other normal pregnancy, third trimester: Secondary | ICD-10-CM

## 2016-06-08 DIAGNOSIS — Z3480 Encounter for supervision of other normal pregnancy, unspecified trimester: Secondary | ICD-10-CM

## 2016-06-08 NOTE — Progress Notes (Signed)
   PRENATAL VISIT NOTE  Subjective:  Traci Rice is a 35 y.o. G2P1001 at 1610w0d being seen today for ongoing prenatal care.  She is currently monitored for the following issues for this low-risk pregnancy and has Dermatographia; Overweight (BMI 25.0-29.9); and Supervision of other normal pregnancy, antepartum on her problem list. On [employer] program.   Patient reports no complaints.  Contractions: Not present. Vag. Bleeding: None.  Movement: Present. Denies leaking of fluid.   The following portions of the patient's history were reviewed and updated as appropriate: allergies, current medications, past family history, past medical history, past social history, past surgical history and problem list. Problem list updated.  Objective:   Vitals:   06/08/16 1532  BP: 107/71  Pulse: (!) 101  Weight: 192 lb (87.1 kg)    Fetal Status: Fetal Heart Rate (bpm): 152 Fundal Height: 32 cm Movement: Present     General:  Alert, oriented and cooperative. Patient is in no acute distress.  Skin: Skin is warm and dry. No rash noted.   Cardiovascular: Normal heart rate noted  Respiratory: Normal respiratory effort, no problems with respiration noted  Abdomen: Soft, gravid, appropriate for gestational age. Pain/Pressure: Present     Pelvic:  Cervical exam deferred        Extremities: Normal range of motion.  Edema: None  Mental Status: Normal mood and affect. Normal behavior. Normal judgment and thought content.   Assessment and Plan:  Pregnancy: G2P1001 at 810w0d  1. Supervision of other normal pregnancy, antepartum Preterm labor symptoms and general obstetric precautions including but not limited to vaginal bleeding, contractions, leaking of fluid and fetal movement were reviewed in detail with the patient. Please refer to After Visit Summary for other counseling recommendations.  Return in about 4 weeks (around 07/06/2016) for OB Visit, Pelvic cultures.  Tereso NewcomerUgonna A Anyanwu, MD

## 2016-06-08 NOTE — Patient Instructions (Signed)
Return to clinic for any scheduled appointments or obstetric concerns, or go to MAU for evaluation  

## 2016-06-24 ENCOUNTER — Other Ambulatory Visit: Payer: BLUE CROSS/BLUE SHIELD

## 2016-06-30 ENCOUNTER — Encounter: Payer: BLUE CROSS/BLUE SHIELD | Admitting: Primary Care

## 2016-07-07 ENCOUNTER — Ambulatory Visit (INDEPENDENT_AMBULATORY_CARE_PROVIDER_SITE_OTHER): Payer: Managed Care, Other (non HMO) | Admitting: Obstetrics and Gynecology

## 2016-07-07 VITALS — BP 109/72 | HR 97 | Wt 199.0 lb

## 2016-07-07 DIAGNOSIS — Z3483 Encounter for supervision of other normal pregnancy, third trimester: Secondary | ICD-10-CM

## 2016-07-07 DIAGNOSIS — Z3480 Encounter for supervision of other normal pregnancy, unspecified trimester: Secondary | ICD-10-CM

## 2016-07-07 DIAGNOSIS — Z113 Encounter for screening for infections with a predominantly sexual mode of transmission: Secondary | ICD-10-CM | POA: Diagnosis not present

## 2016-07-07 NOTE — Progress Notes (Signed)
Prenatal Visit Note Date: 07/07/2016 Clinic: [facility]  Subjective:  Traci Rice is a 35 y.o. G2P1001 at 5221w1d being seen today for ongoing prenatal care.  She is currently monitored for the following issues for this low-risk pregnancy and has Dermatographia; Overweight (BMI 25.0-29.9); and Supervision of other normal pregnancy, antepartum on her problem list.  Patient reports some urine leakage for the past two weeks. No dysuria, hematuria. Also some minimal maxillary sinus pressure but no fevers, chills, chest pain, SOB. Occasional cough with green phlegm.    Contractions: Irregular. Vag. Bleeding: None.  Movement: Present. Denies leaking of fluid.   The following portions of the patient's history were reviewed and updated as appropriate: allergies, current medications, past family history, past medical history, past social history, past surgical history and problem list. Problem list updated.  Objective:   Vitals:   07/07/16 0923  BP: 109/72  Pulse: 97  Weight: 199 lb (90.3 kg)    Fetal Status: Fetal Heart Rate (bpm): 129 Fundal Height: 37 cm Movement: Present  Presentation: Vertex  General:  Alert, oriented and cooperative. Patient is in no acute distress.  Skin: Skin is warm and dry. No rash noted.   Cardiovascular: Normal heart rate noted  Respiratory: Normal respiratory effort, no problems with respiration noted. ctab  Abdomen: Soft, gravid, appropriate for gestational age. Pain/Pressure: Present     Pelvic:  Cervical exam performed Dilation: 1.5 Effacement (%): 50 Station: -3 (pt desired check)  Extremities: Normal range of motion.  Edema: None  Mental Status: Normal mood and affect. Normal behavior. Normal judgment and thought content.   No occipital, maxillary, frontal sinus tenderness Mild erythema on b/l labia with white cottage cheese d/c. Normal white d/c of pregnancy too. Negative cough test  Urinalysis:      Assessment and Plan:   Pregnancy: G2P1001 at 6721w1d  1. Supervision of other normal pregnancy, antepartum Routine care. Considering pp vs interval BTL. Monistat 7 for yeast infection. Will check u/a and culture but likely normal due to pregnancy. Recommend emptying bladder q90-17845m . Likely viral etiology for sinus pressure. OTCs recommended. Urgent care precautions given.  - Culture, beta strep (group b only) - Cervicovaginal ancillary only - Culture, OB Urine  Preterm labor symptoms and general obstetric precautions including but not limited to vaginal bleeding, contractions, leaking of fluid and fetal movement were reviewed in detail with the patient. Please refer to After Visit Summary for other counseling recommendations.  Return in about 2 weeks (around 07/21/2016).   Traci Bingharlie Adlynn Lowenstein, MD

## 2016-07-07 NOTE — Addendum Note (Signed)
Addended by: Gita KudoLASSITER, KRISTEN S on: 07/07/2016 09:56 AM   Modules accepted: Orders

## 2016-07-08 ENCOUNTER — Inpatient Hospital Stay (HOSPITAL_COMMUNITY)
Admission: AD | Admit: 2016-07-08 | Discharge: 2016-07-10 | DRG: 775 | Disposition: A | Discharge status: Home / Self Care | Payer: Managed Care, Other (non HMO) | Source: Ambulatory Visit | Attending: Obstetrics and Gynecology | Admitting: Obstetrics and Gynecology

## 2016-07-08 ENCOUNTER — Encounter (HOSPITAL_COMMUNITY): Payer: Self-pay | Admitting: *Deleted

## 2016-07-08 DIAGNOSIS — Z833 Family history of diabetes mellitus: Secondary | ICD-10-CM

## 2016-07-08 DIAGNOSIS — O42913 Preterm premature rupture of membranes, unspecified as to length of time between rupture and onset of labor, third trimester: Secondary | ICD-10-CM | POA: Diagnosis present

## 2016-07-08 DIAGNOSIS — O42013 Preterm premature rupture of membranes, onset of labor within 24 hours of rupture, third trimester: Secondary | ICD-10-CM | POA: Diagnosis not present

## 2016-07-08 DIAGNOSIS — Z3480 Encounter for supervision of other normal pregnancy, unspecified trimester: Secondary | ICD-10-CM

## 2016-07-08 DIAGNOSIS — Z87891 Personal history of nicotine dependence: Secondary | ICD-10-CM | POA: Diagnosis not present

## 2016-07-08 DIAGNOSIS — Z3A36 36 weeks gestation of pregnancy: Secondary | ICD-10-CM | POA: Diagnosis not present

## 2016-07-08 DIAGNOSIS — Z8249 Family history of ischemic heart disease and other diseases of the circulatory system: Secondary | ICD-10-CM | POA: Diagnosis not present

## 2016-07-08 DIAGNOSIS — O99824 Streptococcus B carrier state complicating childbirth: Secondary | ICD-10-CM | POA: Diagnosis present

## 2016-07-08 DIAGNOSIS — Z823 Family history of stroke: Secondary | ICD-10-CM

## 2016-07-08 LAB — POCT FERN TEST: POCT Fern Test: POSITIVE

## 2016-07-08 LAB — TYPE AND SCREEN
ABO/RH(D): A POS
Antibody Screen: NEGATIVE

## 2016-07-08 LAB — CBC
HEMATOCRIT: 39 % (ref 36.0–46.0)
Hemoglobin: 13.3 g/dL (ref 12.0–15.0)
MCH: 31.1 pg (ref 26.0–34.0)
MCHC: 34.1 g/dL (ref 30.0–36.0)
MCV: 91.1 fL (ref 78.0–100.0)
PLATELETS: 262 10*3/uL (ref 150–400)
RBC: 4.28 MIL/uL (ref 3.87–5.11)
RDW: 13.2 % (ref 11.5–15.5)
WBC: 14 10*3/uL — ABNORMAL HIGH (ref 4.0–10.5)

## 2016-07-08 LAB — ABO/RH: ABO/RH(D): A POS

## 2016-07-08 LAB — RPR: RPR: NONREACTIVE

## 2016-07-08 LAB — CULTURE, OB URINE

## 2016-07-08 LAB — GROUP B STREP BY PCR: Group B strep by PCR: NEGATIVE

## 2016-07-08 LAB — GC/CHLAMYDIA PROBE AMP (~~LOC~~) NOT AT ARMC
Chlamydia: NEGATIVE
NEISSERIA GONORRHEA: NEGATIVE

## 2016-07-08 MED ORDER — IBUPROFEN 600 MG PO TABS
600.0000 mg | ORAL_TABLET | Freq: Four times a day (QID) | ORAL | Status: DC
  Administered 2016-07-08 – 2016-07-10 (×8): 600 mg via ORAL
  Filled 2016-07-08 (×8): qty 1

## 2016-07-08 MED ORDER — DIBUCAINE 1 % RE OINT: 1.0000 "application " | TOPICAL_OINTMENT | RECTAL | Status: DC | PRN

## 2016-07-08 MED ORDER — PENICILLIN G POTASSIUM 5000000 UNITS IJ SOLR
5.0000 10*6.[IU] | Freq: Once | INTRAVENOUS | Status: DC
  Filled 2016-07-08: qty 5

## 2016-07-08 MED ORDER — TETANUS-DIPHTH-ACELL PERTUSSIS 5-2.5-18.5 LF-MCG/0.5 IM SUSP: 0.5000 mL | Freq: Once | INTRAMUSCULAR | Status: DC

## 2016-07-08 MED ORDER — BETAMETHASONE SOD PHOS & ACET 6 (3-3) MG/ML IJ SUSP
12.0000 mg | Freq: Once | INTRAMUSCULAR | Status: DC
  Filled 2016-07-08: qty 2

## 2016-07-08 MED ORDER — LACTATED RINGERS IV SOLN
INTRAVENOUS | Status: DC
  Administered 2016-07-08: 09:00:00 via INTRAVENOUS

## 2016-07-08 MED ORDER — PRENATAL MULTIVITAMIN CH
1.0000 | ORAL_TABLET | Freq: Every day | ORAL | Status: DC
  Administered 2016-07-09: 1 via ORAL
  Filled 2016-07-08: qty 1

## 2016-07-08 MED ORDER — ONDANSETRON HCL 4 MG PO TABS: 4.0000 mg | ORAL_TABLET | ORAL | Status: DC | PRN

## 2016-07-08 MED ORDER — DIPHENHYDRAMINE HCL 25 MG PO CAPS: 25.0000 mg | ORAL_CAPSULE | Freq: Four times a day (QID) | ORAL | Status: DC | PRN

## 2016-07-08 MED ORDER — OXYTOCIN 40 UNITS IN LACTATED RINGERS INFUSION - SIMPLE MED: 10.0000 [IU]/h | Freq: Once | INTRAVENOUS | Status: DC

## 2016-07-08 MED ORDER — ACETAMINOPHEN 325 MG PO TABS: 650.0000 mg | ORAL_TABLET | ORAL | Status: DC | PRN

## 2016-07-08 MED ORDER — METHYLERGONOVINE MALEATE 0.2 MG PO TABS: 0.2000 mg | ORAL_TABLET | ORAL | Status: DC | PRN

## 2016-07-08 MED ORDER — SENNOSIDES-DOCUSATE SODIUM 8.6-50 MG PO TABS
2.0000 | ORAL_TABLET | ORAL | Status: DC
  Administered 2016-07-09 (×2): 2 via ORAL
  Filled 2016-07-08 (×2): qty 2

## 2016-07-08 MED ORDER — ONDANSETRON HCL 4 MG/2ML IJ SOLN: 4.0000 mg | INTRAMUSCULAR | Status: DC | PRN

## 2016-07-08 MED ORDER — WITCH HAZEL-GLYCERIN EX PADS: 1.0000 "application " | MEDICATED_PAD | CUTANEOUS | Status: DC | PRN

## 2016-07-08 MED ORDER — PENICILLIN G POT IN DEXTROSE 60000 UNIT/ML IV SOLN
3.0000 10*6.[IU] | INTRAVENOUS | Status: DC
  Filled 2016-07-08: qty 50

## 2016-07-08 MED ORDER — BENZOCAINE-MENTHOL 20-0.5 % EX AERO
1.0000 "application " | INHALATION_SPRAY | CUTANEOUS | Status: DC | PRN
  Administered 2016-07-08: 1 via TOPICAL
  Filled 2016-07-08: qty 56

## 2016-07-08 MED ORDER — MEASLES, MUMPS & RUBELLA VAC ~~LOC~~ INJ: 0.5000 mL | INJECTION | Freq: Once | SUBCUTANEOUS | Status: DC

## 2016-07-08 MED ORDER — ZOLPIDEM TARTRATE 5 MG PO TABS: 5.0000 mg | ORAL_TABLET | Freq: Every evening | ORAL | Status: DC | PRN

## 2016-07-08 MED ORDER — METHYLERGONOVINE MALEATE 0.2 MG/ML IJ SOLN: 0.2000 mg | INTRAMUSCULAR | Status: DC | PRN

## 2016-07-08 MED ORDER — SIMETHICONE 80 MG PO CHEW: 80.0000 mg | CHEWABLE_TABLET | ORAL | Status: DC | PRN

## 2016-07-08 MED ORDER — FENTANYL CITRATE (PF) 100 MCG/2ML IJ SOLN
100.0000 ug | INTRAMUSCULAR | Status: DC | PRN
  Administered 2016-07-08: 100 ug via INTRAVENOUS
  Filled 2016-07-08: qty 2

## 2016-07-08 MED ORDER — COCONUT OIL OIL
1.0000 "application " | TOPICAL_OIL | Status: DC | PRN
  Administered 2016-07-09: 1 via TOPICAL
  Filled 2016-07-08: qty 120

## 2016-07-08 MED ORDER — OXYTOCIN 40 UNITS IN LACTATED RINGERS INFUSION - SIMPLE MED
INTRAVENOUS | Status: AC
Start: 2016-07-08 — End: 2016-07-08
  Filled 2016-07-08: qty 1000

## 2016-07-08 NOTE — MAU Note (Signed)
Pt went to BR around 0700, noted blood, had passed a clot.  On arrival to MAU noted watery discharge.  Also having uc's also noted when she got here.

## 2016-07-08 NOTE — H&P (Signed)
LABOR AND DELIVERY ADMISSION HISTORY AND PHYSICAL NOTE  Traci Rice is a 35 y.o. female 392P1102 with IUP at 6357w2d by US presenting for preterm labor.   Patient had vaginal bleeding that initially brought her to MAU, while walking back to her room, her water broke and shortly after began having contractions. While in the MAU, she precipitously delivered.   She reports positive fetal movement. She admits to leakage of fluid or vaginal bleeding.  Prenatal History/Complications:  Past Medical History: Past Medical History:  Diagnosis Date  . Abnormal Pap smear   . Attention deficit disorder (ADD)   . GERD (gastroesophageal reflux disease)   . Overweight (BMI 25.0-29.9)   . Syncope     Past Surgical History: Past Surgical History:  Procedure Laterality Date  . laporoscopy     remove cyst from left ovary  . LEEP    . OVARIAN CYST REMOVAL    . WISDOM TOOTH EXTRACTION      Obstetrical History: OB History    Gravida Para Term Preterm AB Living   2 2 1 1   2    SAB TAB Ectopic Multiple Live Births         0 2      Social History: Social History   Social History  . Marital status: Single    Spouse name: N/A  . Number of children: N/A  . Years of education: N/A   Social History Main Topics  . Smoking status: Former Smoker    Types: Cigarettes    Quit date: 08/01/2012  . Smokeless tobacco: Never Used  . Alcohol use No     Comment: rarely, not while pregnant  . Drug use: No  . Sexual activity: Yes    Birth control/ protection: IUD   Other Topics Concern  . None   Social History Narrative   Married.   1 child.   Works in Audiological scientistaccounting.   Enjoys drinking wine, watching movies, shopping.     Family History: Family History  Problem Relation Age of Onset  . Diabetes Mother   . Stroke Paternal Grandfather   . Heart disease Paternal Aunt   . Diabetes Maternal Grandmother   . Kidney failure Maternal Grandmother   . Diabetes Paternal Grandmother   . Cancer  Paternal Grandmother   . Heart murmur Brother     Allergies: Allergies  Allergen Reactions  . Hydrocodone Other (See Comments)    Does not do well with cough syrup and shaky    Prescriptions Prior to Admission  Medication Sig Dispense Refill Last Dose  . Prenatal Vit-Fe Fumarate-FA (MULTIVITAMIN-PRENATAL) 27-0.8 MG TABS tablet Take 1 tablet by mouth daily at 12 noon.   Taking     Review of Systems   All systems reviewed and negative except as stated in HPI  Blood pressure 136/82, pulse 105, temperature 98 F (36.7 C), temperature source Oral, resp. rate 18, unknown if currently breastfeeding. General appearance: alert, cooperative and appears stated age Lungs: clear to auscultation bilaterally Heart: regular rate and rhythm Abdomen: soft, non-tender; bowel sounds normal Extremities: No calf swelling or tenderness Presentation: cephalic Fetal monitoring: Cat I tracing Uterine activity: q 2-3 min Dilation: 10 Effacement (%): 100 Station: 0 Exam by:: Fabian NovemberM. Bhambri CNM   Prenatal labs: ABO, Rh: --/--/A POS (11/30 72530902) Antibody: NEG (11/30 0902) Rubella: !Error! RPR: NON REAC (10/03 1501)  HBsAg: NEGATIVE (05/16 1617)  HIV: NONREACTIVE (10/03 1501)  GBS:   unknown 1 hr Glucola: 3rd trimester-124 Genetic screening:  declined Anatomy US: normal  Prenatal Transfer Tool  Maternal Diabetes: No Genetic Screening: Declined Maternal Ultrasounds/Referrals: Normal Fetal Ultrasounds or other Referrals:  None Maternal Substance Abuse:  No Significant Maternal Medications:  None Significant Maternal Lab Results: None  Results for orders placed or performed during the hospital encounter of 07/08/16 (from the past 24 hour(s))  Fern Test   Collection Time: 07/08/16  8:35 AM  Result Value Ref Range   POCT Fern Test Positive = ruptured amniotic membanes   Group B strep by PCR   Collection Time: 07/08/16  8:45 AM  Result Value Ref Range   Group B strep by PCR NEGATIVE NEGATIVE   CBC   Collection Time: 07/08/16  9:02 AM  Result Value Ref Range   WBC 14.0 (H) 4.0 - 10.5 K/uL   RBC 4.28 3.87 - 5.11 MIL/uL   Hemoglobin 13.3 12.0 - 15.0 g/dL   HCT 16.139.0 09.636.0 - 04.546.0 %   MCV 91.1 78.0 - 100.0 fL   MCH 31.1 26.0 - 34.0 pg   MCHC 34.1 30.0 - 36.0 g/dL   RDW 40.913.2 81.111.5 - 91.415.5 %   Platelets 262 150 - 400 K/uL  Type and screen [facility]   Collection Time: 07/08/16  9:02 AM  Result Value Ref Range   ABO/RH(D) A POS    Antibody Screen NEG    Sample Expiration 07/11/2016     Patient Active Problem List   Diagnosis Date Noted  . Preterm delivery 07/08/2016  . Supervision of other normal pregnancy, antepartum 12/23/2015  . Overweight (BMI 25.0-29.9) 06/30/2015  . Dermatographia 10/25/2011    Assessment: Traci Rice is a 35 y.o. G2P1102 at 3669w2d here for PROM/PTL  #Labor:Anticipate SVD. Betamethasone x 1 dose. #Pain: IV pain meds prn/epidural on request #FWB: Cat I tracing #ID:  GBS positive-Ampicillin 2g #MOF: Breast #MOC:BTL #Circ:  No  WALLACE, NOAH I, DO PGY-3 07/08/2016, 10:28 AM    OB FELLOW HISTORY AND PHYSICAL ATTESTATION  I have seen and examined this patient; I agree with above documentation in the resident's note.    Jen MowElizabeth Adelin Ventrella, DO [place] Fellow 07/08/2016

## 2016-07-08 NOTE — Lactation Note (Signed)
This note was copied from a baby's chart. Lactation Consultation Note  Patient Name: Traci Rice ZOXWR'UToday's Date: 07/08/2016 Reason for consult: Initial assessment;Late preterm infant Mom reports she was not successful BF her 1st baby, latch was too painful and she had cracked/bleeding nipples. This baby has given her bruise on left aerola, Mom reports [facility] but [facility] did not latch well. Basic teaching reviewed with Mom, discussed LPT behaviors/feeding policy, hand out given. Set up DEBP for Mom to start pumping every 3 hours to encourage milk production and to have EBM to supplement. If supplementing with formula Mom prefers to use Enfamil Newborn. Baby asleep at this visit at 2 hours of age, did not attempt to wake baby to BF. Lactation brochure left for review, advised of OP services and support group. Encouraged Mom to call with next feeding, will follow up today.   Maternal Data Has patient been taught Hand Expression?: Yes Does the patient have breastfeeding experience prior to this delivery?: Yes  Feeding Feeding Type: Formula Nipple Type: Slow - flow Length of feed:  (few sucks)  LATCH Score/Interventions                      Lactation Tools Discussed/Used Tools: Pump Breast pump type: Double-Electric Breast Pump WIC Program: No   Consult Status Consult Status: Follow-up Date: 07/08/16 Follow-up type: In-patient    Alfred LevinsGranger, Idelle Reimann Ann 07/08/2016, 2:11 PM

## 2016-07-08 NOTE — Lactation Note (Signed)
This note was copied from a baby's chart. Lactation Consultation Note  Patient Name: Boy Chriss Czarshleigh Younts ZOXWR'UToday's Date: 07/08/2016 Reason for consult: Follow-up assessment;Late preterm infant Mom pumping received 1-2 drops from left breast. Demonstrated awakening techniques and suck training to Mom to help with latch. With latching baby, it took few attempts for baby to obtain good depth but once he did he demonstrated good suckling bursts but Mom reports too much pain. Tried nipple shield to see if this would help but Mom reports continued pain, no improvement with nipple shield. Mom reports she has very sensitive nipples even when she is not pregnant. Mom did not want to continue working with latch she prefer to pump/bottle feed. Advised to pump every 3 hours for 15 minutes, feed baby with EBM or formula starting with 10 ml every 3 hours increasing to satisfy baby. RN assisted Mom with giving 1st bottle. Mom has DEBP for home. Mom to call for questions/concerns or assist as needed.   Maternal Data Has patient been taught Hand Expression?: Yes Does the patient have breastfeeding experience prior to this delivery?: Yes  Feeding Feeding Type: Formula Nipple Type: Slow - flow Length of feed: 5 min  LATCH Score/Interventions Latch: Repeated attempts needed to sustain latch, nipple held in mouth throughout feeding, stimulation needed to elicit sucking reflex. Intervention(s): Adjust position;Assist with latch;Breast massage;Breast compression  Audible Swallowing: None  Type of Nipple: Flat  Comfort (Breast/Nipple): Filling, red/small blisters or bruises, mild/mod discomfort  Problem noted: Mild/Moderate discomfort (bruise left aerola) Interventions (Mild/moderate discomfort): Hand expression;Hand massage;Post-pump  Hold (Positioning): Assistance needed to correctly position infant at breast and maintain latch.  LATCH Score: 4  Lactation Tools Discussed/Used Tools: Nipple  Dorris CarnesShields;Pump Nipple shield size: 20 Breast pump type: Double-Electric Breast Pump WIC Program: No   Consult Status Consult Status: Follow-up Date: 07/09/16 Follow-up type: In-patient    Alfred LevinsGranger, Rosita Guzzetta Ann 07/08/2016, 2:18 PM

## 2016-07-09 ENCOUNTER — Encounter (HOSPITAL_COMMUNITY): Payer: Self-pay | Admitting: Anesthesiology

## 2016-07-09 ENCOUNTER — Encounter (HOSPITAL_COMMUNITY)
Admission: AD | Disposition: A | Discharge status: Home / Self Care | Payer: Self-pay | Source: Ambulatory Visit | Attending: Obstetrics and Gynecology

## 2016-07-09 SURGERY — LIGATION, FALLOPIAN TUBE, POSTPARTUM
Anesthesia: Choice

## 2016-07-09 MED ORDER — FAMOTIDINE 20 MG PO TABS
40.0000 mg | ORAL_TABLET | Freq: Once | ORAL | Status: AC
  Administered 2016-07-09: 40 mg via ORAL
  Filled 2016-07-09: qty 2

## 2016-07-09 MED ORDER — METOCLOPRAMIDE HCL 10 MG PO TABS
10.0000 mg | ORAL_TABLET | Freq: Once | ORAL | Status: AC
  Administered 2016-07-09: 10 mg via ORAL
  Filled 2016-07-09: qty 1

## 2016-07-09 MED ORDER — LACTATED RINGERS IV SOLN
INTRAVENOUS | Status: DC
  Administered 2016-07-09: 10:00:00 via INTRAVENOUS

## 2016-07-09 NOTE — Progress Notes (Signed)
Pt instructed to not drink or eat anything after administration of medications given at 01:00 am. Pt verbalized understanding.

## 2016-07-09 NOTE — Anesthesia Preprocedure Evaluation (Deleted)
Anesthesia Evaluation  Patient identified by MRN, date of birth, ID band Patient awake    Reviewed: Allergy & Precautions, NPO status , Patient's Chart, lab work & pertinent test results  Airway Mallampati: II  TM Distance: >3 FB Neck ROM: Full    Dental no notable dental hx. (+) Teeth Intact   Pulmonary former smoker,    Pulmonary exam normal breath sounds clear to auscultation       Cardiovascular negative cardio ROS Normal cardiovascular exam Rhythm:Regular Rate:Normal     Neuro/Psych ADDnegative neurological ROS     GI/Hepatic Neg liver ROS, GERD  Medicated and Controlled,  Endo/Other  Obesity  Renal/GU negative Renal ROS  negative genitourinary   Musculoskeletal Dermatographia   Abdominal (+) + obese,   Peds  Hematology negative hematology ROS (+)   Anesthesia Other Findings   Reproductive/Obstetrics (+) Pregnancy                             Lab Results  Component Value Date   WBC 14.0 (H) 07/08/2016   HGB 13.3 07/08/2016   HCT 39.0 07/08/2016   MCV 91.1 07/08/2016   PLT 262 07/08/2016     Chemistry      Component Value Date/Time   NA 139 05/22/2015 0910   K 4.3 05/22/2015 0910   CL 102 05/22/2015 0910   CO2 27 05/22/2015 0910   BUN 9 05/22/2015 0910   CREATININE 0.58 05/22/2015 0910      Component Value Date/Time   CALCIUM 9.7 05/22/2015 0910   ALKPHOS 72 05/22/2015 0910   AST 14 05/22/2015 0910   ALT 9 05/22/2015 0910   BILITOT 0.7 05/22/2015 0910      Anesthesia Physical Anesthesia Plan  ASA: II  Anesthesia Plan: General   Post-op Pain Management:    Induction: Intravenous, Rapid sequence and Cricoid pressure planned  Airway Management Planned: Oral ETT  Additional Equipment:   Intra-op Plan:   Post-operative Plan: Extubation in OR  Informed Consent: I have reviewed the patients History and Physical, chart, labs and discussed the procedure  including the risks, benefits and alternatives for the proposed anesthesia with the patient or authorized representative who has indicated his/her understanding and acceptance.   Dental advisory given  Plan Discussed with: Anesthesiologist, CRNA and Surgeon  Anesthesia Plan Comments:         Anesthesia Quick Evaluation

## 2016-07-09 NOTE — Lactation Note (Signed)
This note was copied from a baby's chart. Lactation Consultation Note  Patient Name: Boy Chriss Czarshleigh Younts ZOXWR'UToday's Date: 07/09/2016 Reason for consult: Follow-up assessment (2% weight loss , mom has decided to pump and bottle feed , see LC note ) Baby is 24 hours old. Per mom ready to go to her BTL .  LC discussed with mom supply and demand  And the importance of consistent pumping around the clock every 2-3 hours. @ least 8 times a day for 15 -20 mins. Per mom has been pumping about every 3 hours , and still no results. Per mom my nipples are very sensitive and should I  Increase flange to the #27. Per mom the #24 Flanges are comfortable and don't feel tight. LC recommended applying a dab of coconut oil  To the nipple and areola prior to pumping both breast , massage , hand express and then pump. Reassured mom the coconut oil should help.  LC reviewed engorgement prevention and tx . Per mom will have a DEBP Medela for home.    Maternal Data Has patient been taught Hand Expression?:  (per mom feels comfortable )  Feeding Feeding Type: Formula Nipple Type: Slow - flow  LATCH Score/Interventions                Intervention(s): Breastfeeding basics reviewed     Lactation Tools Discussed/Used Tools: Pump Breast pump type: Double-Electric Breast Pump Pump Review: Setup, frequency, and cleaning (reviewed ) Initiated by:: set up yesterday    Consult Status Consult Status: Follow-up Date: 07/10/16 Follow-up type: In-patient    Matilde SprangMargaret Ann Youlanda Tomassetti 07/09/2016, 10:16 AM

## 2016-07-09 NOTE — Progress Notes (Signed)
Post Partum Day #1 Subjective: no complaints, up ad lib, voiding and tolerating PO.  Desires BTL, has been NPO since Midnight.  Desires general anesthesia for BTL.   Objective: Blood pressure 109/66, pulse 84, temperature 98.1 F (36.7 C), temperature source Oral, resp. rate 18, weight 199 lb (90.3 kg), SpO2 98 %, unknown if currently breastfeeding.  Physical Exam:  General: alert, cooperative and no distress Lochia: appropriate Uterine Fundus: firm Incision: no significant drainage, no dehiscence, no significant erythema DVT Evaluation: No evidence of DVT seen on physical exam. No cords or calf tenderness. No significant calf/ankle edema.   Recent Labs  07/08/16 0902  HGB 13.3  HCT 39.0    Assessment/Plan: Plan for discharge tomorrow, Breastfeeding and Contraception BTL scheduled for this AM   LOS: 1 day   Roe CoombsRachelle A Lanorris Kalisz, CNM 07/09/2016, 9:19 AM

## 2016-07-09 NOTE — Plan of Care (Signed)
Problem: Education: Goal: Knowledge of condition will improve Outcome: Completed/Met Date Met: 07/09/16 Plans outpatient tubal

## 2016-07-10 LAB — CULTURE, BETA STREP (GROUP B ONLY)

## 2016-07-10 MED ORDER — SCOPOLAMINE 1 MG/3DAYS TD PT72
1.0000 | MEDICATED_PATCH | TRANSDERMAL | Status: DC
  Filled 2016-07-10: qty 1

## 2016-07-10 MED ORDER — MEPERIDINE HCL 25 MG/ML IJ SOLN: 6.2500 mg | INTRAMUSCULAR | Status: DC | PRN

## 2016-07-10 MED ORDER — METOCLOPRAMIDE HCL 5 MG/ML IJ SOLN: 10.0000 mg | Freq: Once | INTRAMUSCULAR | Status: DC | PRN

## 2016-07-10 MED ORDER — FENTANYL CITRATE (PF) 100 MCG/2ML IJ SOLN: 25.0000 ug | INTRAMUSCULAR | Status: DC | PRN

## 2016-07-10 MED ORDER — IBUPROFEN 600 MG PO TABS: 600.0000 mg | ORAL_TABLET | Freq: Four times a day (QID) | ORAL | 0 refills | Status: DC

## 2016-07-10 NOTE — Discharge Summary (Signed)
OB Discharge Summary     Patient Name: Traci Rice DOB: 06/13/1981 MRN: [id number]  Date of admission: 07/08/2016 Delivering MD: Michaele OfferMUMAW, ELIZABETH WOODLAND   Date of discharge: 07/10/2016  Admitting diagnosis: 36WKS, BLEEDING Desires Sterilization Intrauterine pregnancy: 385w2d     Secondary diagnosis:  Active Problems:   Preterm delivery  Additional problems: none     Discharge diagnosis: Preterm Pregnancy Delivered precipitously in MAU                                                                                               Post partum procedures:none  Augmentation: none  Complications: None  Hospital course:  Onset of Labor With Vaginal Delivery     35 y.o. yo [id number] at 2785w2d was admitted in Latent Labor on 07/08/2016. She was being evaluated in MAU for vag bldg, when she had SROM followed by precipitously delivery. Patient had an uncomplicated labor course as follows:  Membrane Rupture Time/Date: 7:30 AM ,07/08/2016   Intrapartum Procedures: Episiotomy: None [1]                                         Lacerations:  2nd degree [3];Perineal [11]  Patient had a delivery of a Viable infant. 07/08/2016  Information for the patient's newborn:  Traci Rice, Traci Rice [[id number]][[id number]]  Delivery Method: Vag-Spont    Pateint had an uncomplicated postpartum course.  She is ambulating, tolerating a regular diet, passing flatus, and urinating well. She decided against an inpt BTL and will have it done intervally instead.  Patient is discharged home in stable condition on 07/10/16.    Physical exam Vitals:   07/09/16 0955 07/09/16 1700 07/09/16 1727 07/10/16 0617  BP: 100/68  109/72 98/72  Pulse: 95  92 86  Resp: 18  18 18   Temp: 97.8 F (36.6 C)  98.2 F (36.8 C) 98.3 F (36.8 C)  TempSrc: Oral   Oral  SpO2:      Weight:  90.3 kg (199 lb)     General: alert and cooperative Lochia: appropriate Uterine Fundus: firm Incision: N/A DVT Evaluation: No evidence of DVT  seen on physical exam. Labs: Lab Results  Component Value Date   WBC 14.0 (H) 07/08/2016   HGB 13.3 07/08/2016   HCT 39.0 07/08/2016   MCV 91.1 07/08/2016   PLT 262 07/08/2016   CMP Latest Ref Rng & Units 05/22/2015  Glucose 65 - 99 mg/dL 80  BUN 7 - 25 mg/dL 9  Creatinine 1.300.50 - 8.651.10 mg/dL 7.840.58  Sodium 696135 - 295146 mmol/L 139  Potassium 3.5 - 5.3 mmol/L 4.3  Chloride 98 - 110 mmol/L 102  CO2 20 - 31 mmol/L 27  Calcium 8.6 - 10.2 mg/dL 9.7  Total Protein 6.1 - 8.1 g/dL 7.1  Total Bilirubin 0.2 - 1.2 mg/dL 0.7  Alkaline Phos 33 - 115 U/L 72  AST 10 - 30 U/L 14  ALT 6 - 29 U/L 9    Discharge instruction: per After Visit Summary and "Baby and  Me Booklet".  After visit meds:    Medication List    STOP taking these medications   pseudoephedrine-acetaminophen 30-500 MG Tabs tablet Commonly known as:  TYLENOL SINUS     TAKE these medications   ibuprofen 600 MG tablet Commonly known as:  ADVIL,MOTRIN Take 1 tablet (600 mg total) by mouth every 6 (six) hours.   multivitamin-prenatal 27-0.8 MG Tabs tablet Take 1 tablet by mouth daily at 12 noon.       Diet: routine diet  Activity: Advance as tolerated. Pelvic rest for 6 weeks.   Outpatient follow up:6 weeks Follow up Appt:  Follow up Visit:No Follow-up on file.  Postpartum contraception: Tubal Ligation- interval  Newborn Data: Live born female  Birth Weight: 6 lb 15.5 oz (3160 g) APGAR: 9, 9  Baby Feeding: pumping/bottle Disposition:home with mother   07/10/2016 Cam HaiSHAW, Brenon Antosh, CNM  9:17 AM

## 2016-07-10 NOTE — Discharge Instructions (Signed)

## 2016-07-20 ENCOUNTER — Encounter: Payer: Managed Care, Other (non HMO) | Admitting: Obstetrics & Gynecology

## 2016-07-22 ENCOUNTER — Encounter (INDEPENDENT_AMBULATORY_CARE_PROVIDER_SITE_OTHER): Payer: Managed Care, Other (non HMO) | Admitting: *Deleted

## 2016-08-16 ENCOUNTER — Encounter: Payer: Self-pay | Admitting: Obstetrics & Gynecology

## 2016-08-16 ENCOUNTER — Ambulatory Visit (INDEPENDENT_AMBULATORY_CARE_PROVIDER_SITE_OTHER): Payer: Managed Care, Other (non HMO) | Admitting: Obstetrics & Gynecology

## 2016-08-16 DIAGNOSIS — Z113 Encounter for screening for infections with a predominantly sexual mode of transmission: Secondary | ICD-10-CM | POA: Diagnosis not present

## 2016-08-16 DIAGNOSIS — B9689 Other specified bacterial agents as the cause of diseases classified elsewhere: Secondary | ICD-10-CM

## 2016-08-16 DIAGNOSIS — N898 Other specified noninflammatory disorders of vagina: Secondary | ICD-10-CM | POA: Diagnosis not present

## 2016-08-16 DIAGNOSIS — Z3009 Encounter for other general counseling and advice on contraception: Secondary | ICD-10-CM

## 2016-08-16 DIAGNOSIS — N760 Acute vaginitis: Secondary | ICD-10-CM

## 2016-08-16 NOTE — Patient Instructions (Addendum)
Return to clinic for any scheduled appointments or for any gynecologic concerns as needed.     Laparoscopic Tubal Ligation Introduction Laparoscopic tubal ligation is a procedure to close the fallopian tubes. This is done so that you cannot get pregnant. When the fallopian tubes are closed, the eggs that your ovaries release cannot enter the uterus, and sperm cannot reach the released eggs. A laparoscopic tubal ligation is sometimes called "getting your tubes tied." You should not have this procedure if you want to get pregnant someday or if you are unsure about having more children. Tell a health care provider about:  Any allergies you have.  All medicines you are taking, including vitamins, herbs, eye drops, creams, and over-the-counter medicines.  Any problems you or family members have had with anesthetic medicines.  Any blood disorders you have.  Any surgeries you have had.  Any medical conditions you have.  Whether you are pregnant or may be pregnant.  Any past pregnancies. What are the risks? Generally, this is a safe procedure. However, problems may occur, including:  Infection.  Bleeding.  Injury to surrounding organs.  Side effects from anesthetics.  Failure of the procedure. This procedure can increase your risk of a kind of pregnancy in which a fertilized egg attaches to the outside of the uterus (ectopic pregnancy). What happens before the procedure?  Ask your health care provider about:  Changing or stopping your regular medicines. This is especially important if you are taking diabetes medicines or blood thinners.  Taking medicines such as aspirin and ibuprofen. These medicines can thin your blood. Do not take these medicines before your procedure if your health care provider instructs you not to.  Follow instructions from your health care provider about eating and drinking restrictions.  Plan to have someone take you home after the procedure.  If you  go home right after the procedure, plan to have someone with you for 24 hours. What happens during the procedure?  You will be given one or more of the following:  A medicine to help you relax (sedative).  A medicine to numb the area (local anesthetic).  A medicine to make you fall asleep (general anesthetic).  A medicine that is injected into an area of your body to numb everything below the injection site (regional anesthetic).  An IV tube will be inserted into one of your veins. It will be used to give you medicines and fluids during the procedure.  Your bladder may be emptied with a small tube (catheter).  If you have been given a general anesthetic, a tube will be put down your throat to help you breathe.  Two small cuts (incisions) will be made in your lower abdomen and near your belly button.  Your abdomen will be inflated with a gas. This will let the surgeon see better and will give the surgeon room to work.  A thin, lighted tube (laparoscope) with a camera attached will be inserted into your abdomen through one of the incisions. Small instruments will be inserted through the other incision.  The fallopian tubes will be tied off, burned (cauterized), or blocked with a clip, ring, or clamp. A small portion in the center of each fallopian tube may be removed.  The gas will be released from the abdomen.  The incisions will be closed with stitches (sutures).  A bandage (dressing) will be placed over the incisions. The procedure may vary among health care providers and hospitals. What happens after the procedure?  Your  blood pressure, heart rate, breathing rate, and blood oxygen level will be monitored often until the medicines you were given have worn off.  You will be given medicine to help with pain, nausea, and vomiting as needed. This information is not intended to replace advice given to you by your health care provider. Make sure you discuss any questions you have  with your health care provider. Document Released: 11/01/2000 Document Revised: 01/01/2016 Document Reviewed: 07/06/2015  2017 Elsevier

## 2016-08-16 NOTE — Progress Notes (Signed)
 Subjective:     Traci Rice is a 36 y.o. G2P1102 female who presents for a postpartum visit. She is 6 weeks postpartum following a spontaneous vaginal delivery. I have fully reviewed the prenatal and intrapartum course. The delivery was at [redacted]w[redacted]d gestational weeks. Outcome: spontaneous vaginal delivery. Anesthesia: none. Postpartum course has been uncomplicated. Baby's course has been uncomplicated. Baby is feeding by breast. Bleeding no bleeding. Bowel function is normal. Bladder function is normal. Patient is not sexually active. Planned contraception method is tubal ligation; condoms in the interim.  Postpartum depression screening: negative. Reports itchy, vaginal discharge for a few weeks.  The following portions of the patient's history were reviewed and updated as appropriate: allergies, current medications, past family history, past medical history, past social history, past surgical history and problem list.  Review of Systems Pertinent items noted in HPI and remainder of comprehensive ROS otherwise negative.   Objective:    BP 98/65 (BP Location: Left Arm, Patient Position: Sitting, Cuff Size: Normal)   Pulse 86   Resp 18   Ht 5' 4" (1.626 m)   Wt 172 lb (78 kg)   LMP 08/08/2016   Breastfeeding? Yes   BMI 29.52 kg/m   General:  alert and no distress   Breasts:  inspection negative, no nipple discharge or bleeding, no masses or nodularity palpable  Lungs: clear to auscultation bilaterally  Heart:  regular rate and rhythm  Abdomen: soft, non-tender; bowel sounds normal; no masses,  no organomegaly   Vulva:  normal and scant thin white discharge seen. Wet prep obtained.  Vagina: normal vagina  Cervix:  not evaluated  Corpus: not examined  Adnexa:  not evaluated  Rectal Exam: Not performed.        Assessment and Plan:   1. Routine postpartum follow-up Normal postpartum exam  2. Vaginal discharge - Cervicovaginal ancillary done, will follow up results and manage  accordingly.  3. Consultation for female sterilization Patient desires bilateral tubal sterilization.  Other reversible forms of contraception were discussed with patient; she declines all other modalities. Discussed bilateral tubal sterilization in detail; discussed options of laparoscopic bilateral tubal sterilization using Filshie clips. Risks and benefits discussed in detail including but not limited to: risk of regret, permanence of method, bleeding, infection, injury to surrounding organs and need for additional procedures.  Failure risk of 1-2 % for Filshie clips with increased risk of ectopic gestation if pregnancy occurs was also discussed with patient.  Patient verbalized understanding of these risks and benefits and wants to proceed with sterilization with laparoscopic bilateral sterilization using Filshie clips.  She was told that she will be contacted by our surgical scheduler regarding the time and date of her surgery; routine preoperative instructions of having nothing to eat or drink after midnight on the day prior to surgery and also coming to the hospital 1 1/2 hours prior to her time of surgery were also emphasized.  She was told she may be called for a preoperative appointment about a week prior to surgery and will be given further preoperative instructions at that visit.  Routine postoperative instructions will be reviewed with the patient and her family in detail after surgery. Printed patient education handouts about the procedure was given to the patient to review at home.  In the meantime, patient will use condoms for contraception prior to surgery.  Routine preventative health maintenance measures emphasized.   Akio Hudnall, MD, FACOG Attending Obstetrician & Gynecologist, [facility], [facility]

## 2016-08-18 LAB — CERVICOVAGINAL ANCILLARY ONLY
Bacterial vaginitis: POSITIVE — AB
Candida vaginitis: NEGATIVE
Trichomonas: NEGATIVE

## 2016-08-18 MED ORDER — METRONIDAZOLE 500 MG PO TABS: 500.0000 mg | ORAL_TABLET | Freq: Two times a day (BID) | ORAL | 0 refills | Status: DC

## 2016-08-18 NOTE — Addendum Note (Signed)
Addended by: Jaynie CollinsANYANWU, Raymondo Garcialopez A on: 08/18/2016 10:41 AM   Modules accepted: Orders

## 2016-08-31 ENCOUNTER — Encounter (HOSPITAL_COMMUNITY): Payer: Self-pay

## 2016-09-07 NOTE — Patient Instructions (Signed)
Your procedure is scheduled on:  Monday, Sep 13, 2016  [facility]:  8:00 AM  Pick up the phone at the desk and dial [phone].  Call this number if you have problems the morning of surgery: [phone].  Remember:  Do NOT eat food or drink after:  Midnight Sunday  Take these medicines the morning of surgery with a SIP OF WATER:  None  Stop ALL herbal medications at this time  Do NOT smoke the day of surgery.  Do NOT wear jewelry (body piercing), metal hair clips/bobby pins, make-up, or nail polish. Do NOT wear lotions, powders, or perfumes.  You may wear deodorant. Do NOT shave for 48 hours prior to surgery. Do NOT bring valuables to the hospital. Contacts, dentures, or bridgework may not be worn into surgery.  Have a responsible adult drive you home and stay with you for 24 hours after your procedure  Bring a copy of your healthcare power of attorney and living will documents.  **Effective Friday, Jan. 12, 2018, [place] will implement no hospital visitations from children age 35 and younger due to a steady increase in flu activity in our community and hospitals. **

## 2016-09-09 ENCOUNTER — Encounter (HOSPITAL_COMMUNITY): Payer: Self-pay

## 2016-09-09 ENCOUNTER — Encounter (HOSPITAL_COMMUNITY)
Admission: RE | Admit: 2016-09-09 | Discharge: 2016-09-09 | Disposition: A | Discharge status: Home / Self Care | Payer: Managed Care, Other (non HMO) | Source: Ambulatory Visit | Attending: Obstetrics & Gynecology | Admitting: Obstetrics & Gynecology

## 2016-09-09 DIAGNOSIS — Z01812 Encounter for preprocedural laboratory examination: Secondary | ICD-10-CM | POA: Diagnosis present

## 2016-09-09 LAB — CBC
HCT: 38.9 % (ref 36.0–46.0)
HEMOGLOBIN: 13.4 g/dL (ref 12.0–15.0)
MCH: 30.2 pg (ref 26.0–34.0)
MCHC: 34.4 g/dL (ref 30.0–36.0)
MCV: 87.8 fL (ref 78.0–100.0)
PLATELETS: 246 10*3/uL (ref 150–400)
RBC: 4.43 MIL/uL (ref 3.87–5.11)
RDW: 12.5 % (ref 11.5–15.5)
WBC: 6.9 10*3/uL (ref 4.0–10.5)

## 2016-09-13 ENCOUNTER — Telehealth: Payer: Self-pay | Admitting: Radiology

## 2016-09-13 ENCOUNTER — Ambulatory Visit (HOSPITAL_COMMUNITY): Payer: Managed Care, Other (non HMO) | Admitting: Anesthesiology

## 2016-09-13 ENCOUNTER — Encounter (HOSPITAL_COMMUNITY): Payer: Self-pay

## 2016-09-13 ENCOUNTER — Encounter (HOSPITAL_COMMUNITY)
Admission: RE | Disposition: A | Discharge status: Home / Self Care | Payer: Self-pay | Source: Ambulatory Visit | Attending: Obstetrics & Gynecology

## 2016-09-13 ENCOUNTER — Ambulatory Visit (HOSPITAL_COMMUNITY)
Admission: RE | Admit: 2016-09-13 | Discharge: 2016-09-13 | Disposition: A | Discharge status: Home / Self Care | Payer: Managed Care, Other (non HMO) | Source: Ambulatory Visit | Attending: Obstetrics & Gynecology | Admitting: Obstetrics & Gynecology

## 2016-09-13 DIAGNOSIS — Z87891 Personal history of nicotine dependence: Secondary | ICD-10-CM | POA: Insufficient documentation

## 2016-09-13 DIAGNOSIS — Z302 Encounter for sterilization: Secondary | ICD-10-CM | POA: Insufficient documentation

## 2016-09-13 HISTORY — PX: LAPAROSCOPIC TUBAL LIGATION: SHX1937

## 2016-09-13 HISTORY — DX: Dermatographic urticaria: L50.3

## 2016-09-13 LAB — PREGNANCY, URINE: Preg Test, Ur: NEGATIVE

## 2016-09-13 SURGERY — LIGATION, FALLOPIAN TUBE, LAPAROSCOPIC
Anesthesia: General | Site: Abdomen | Laterality: Bilateral

## 2016-09-13 MED ORDER — FLUCONAZOLE 150 MG PO TABS: 150.0000 mg | ORAL_TABLET | ORAL | 3 refills | Status: DC

## 2016-09-13 MED ORDER — DOCUSATE SODIUM 100 MG PO CAPS: 100.0000 mg | ORAL_CAPSULE | Freq: Two times a day (BID) | ORAL | 2 refills | Status: DC | PRN

## 2016-09-13 MED ORDER — KETOROLAC TROMETHAMINE 30 MG/ML IJ SOLN: 30.0000 mg | Freq: Once | INTRAMUSCULAR | Status: DC

## 2016-09-13 MED ORDER — SUGAMMADEX SODIUM 200 MG/2ML IV SOLN
INTRAVENOUS | Status: DC | PRN
  Administered 2016-09-13: 156 mg via INTRAVENOUS

## 2016-09-13 MED ORDER — FENTANYL CITRATE (PF) 100 MCG/2ML IJ SOLN: 25.0000 ug | INTRAMUSCULAR | Status: DC | PRN

## 2016-09-13 MED ORDER — ONDANSETRON HCL 4 MG/2ML IJ SOLN
INTRAMUSCULAR | Status: AC
  Filled 2016-09-13: qty 2

## 2016-09-13 MED ORDER — LIDOCAINE HCL (CARDIAC) 20 MG/ML IV SOLN
INTRAVENOUS | Status: AC
  Filled 2016-09-13: qty 5

## 2016-09-13 MED ORDER — ROCURONIUM BROMIDE 100 MG/10ML IV SOLN
INTRAVENOUS | Status: DC | PRN
  Administered 2016-09-13: 26 mg via INTRAVENOUS

## 2016-09-13 MED ORDER — OXYCODONE-ACETAMINOPHEN 5-325 MG PO TABS: 1.0000 | ORAL_TABLET | Freq: Four times a day (QID) | ORAL | 0 refills | Status: DC | PRN

## 2016-09-13 MED ORDER — SCOPOLAMINE 1 MG/3DAYS TD PT72
1.0000 | MEDICATED_PATCH | TRANSDERMAL | Status: DC
  Administered 2016-09-13: 1.5 mg via TRANSDERMAL

## 2016-09-13 MED ORDER — ONDANSETRON HCL 4 MG/2ML IJ SOLN: 4.0000 mg | Freq: Once | INTRAMUSCULAR | Status: DC | PRN

## 2016-09-13 MED ORDER — KETOROLAC TROMETHAMINE 30 MG/ML IJ SOLN
INTRAMUSCULAR | Status: AC
  Filled 2016-09-13: qty 1

## 2016-09-13 MED ORDER — BUPIVACAINE HCL (PF) 0.5 % IJ SOLN
INTRAMUSCULAR | Status: AC
  Filled 2016-09-13: qty 30

## 2016-09-13 MED ORDER — GLYCOPYRROLATE 0.2 MG/ML IJ SOLN
INTRAMUSCULAR | Status: DC | PRN
  Administered 2016-09-13: 0.1 mg via INTRAVENOUS

## 2016-09-13 MED ORDER — MIDAZOLAM HCL 2 MG/2ML IJ SOLN
INTRAMUSCULAR | Status: AC
  Filled 2016-09-13: qty 2

## 2016-09-13 MED ORDER — PROPOFOL 10 MG/ML IV BOLUS
INTRAVENOUS | Status: AC
  Filled 2016-09-13: qty 20

## 2016-09-13 MED ORDER — MEPERIDINE HCL 25 MG/ML IJ SOLN: 6.2500 mg | INTRAMUSCULAR | Status: DC | PRN

## 2016-09-13 MED ORDER — BUPIVACAINE HCL (PF) 0.5 % IJ SOLN
INTRAMUSCULAR | Status: DC | PRN
  Administered 2016-09-13: 10 mL
  Administered 2016-09-13: 20 mL

## 2016-09-13 MED ORDER — IBUPROFEN 600 MG PO TABS: 600.0000 mg | ORAL_TABLET | Freq: Four times a day (QID) | ORAL | 2 refills | Status: DC

## 2016-09-13 MED ORDER — SUGAMMADEX SODIUM 200 MG/2ML IV SOLN
INTRAVENOUS | Status: AC
  Filled 2016-09-13: qty 2

## 2016-09-13 MED ORDER — DEXAMETHASONE SODIUM PHOSPHATE 4 MG/ML IJ SOLN
INTRAMUSCULAR | Status: DC | PRN
  Administered 2016-09-13: 10 mg via INTRAVENOUS

## 2016-09-13 MED ORDER — FENTANYL CITRATE (PF) 250 MCG/5ML IJ SOLN
INTRAMUSCULAR | Status: AC
  Filled 2016-09-13: qty 5

## 2016-09-13 MED ORDER — DEXAMETHASONE SODIUM PHOSPHATE 10 MG/ML IJ SOLN
INTRAMUSCULAR | Status: AC
  Filled 2016-09-13: qty 1

## 2016-09-13 MED ORDER — LIDOCAINE HCL (CARDIAC) 20 MG/ML IV SOLN
INTRAVENOUS | Status: DC | PRN
  Administered 2016-09-13: 80 mg via INTRAVENOUS

## 2016-09-13 MED ORDER — LACTATED RINGERS IV SOLN
INTRAVENOUS | Status: DC
  Administered 2016-09-13: 125 mL/h via INTRAVENOUS
  Administered 2016-09-13: 10:00:00 via INTRAVENOUS

## 2016-09-13 MED ORDER — FENTANYL CITRATE (PF) 100 MCG/2ML IJ SOLN
INTRAMUSCULAR | Status: DC | PRN
  Administered 2016-09-13: 100 ug via INTRAVENOUS
  Administered 2016-09-13 (×2): 50 ug via INTRAVENOUS

## 2016-09-13 MED ORDER — PROPOFOL 10 MG/ML IV BOLUS
INTRAVENOUS | Status: DC | PRN
  Administered 2016-09-13: 170 mg via INTRAVENOUS

## 2016-09-13 MED ORDER — DEXAMETHASONE SODIUM PHOSPHATE 4 MG/ML IJ SOLN
INTRAMUSCULAR | Status: AC
Start: 2016-09-13 — End: 2016-09-13
  Filled 2016-09-13: qty 1

## 2016-09-13 MED ORDER — MIDAZOLAM HCL 2 MG/2ML IJ SOLN
INTRAMUSCULAR | Status: DC | PRN
  Administered 2016-09-13: 1 mg via INTRAVENOUS

## 2016-09-13 MED ORDER — ONDANSETRON HCL 4 MG/2ML IJ SOLN
INTRAMUSCULAR | Status: DC | PRN
Start: 2016-09-13 — End: 2016-09-13
  Administered 2016-09-13: 4 mg via INTRAVENOUS

## 2016-09-13 MED ORDER — HYDROMORPHONE HCL 1 MG/ML IJ SOLN
INTRAMUSCULAR | Status: AC
  Filled 2016-09-13: qty 1

## 2016-09-13 MED ORDER — SCOPOLAMINE 1 MG/3DAYS TD PT72
MEDICATED_PATCH | TRANSDERMAL | Status: AC
  Filled 2016-09-13: qty 1

## 2016-09-13 MED ORDER — ROCURONIUM BROMIDE 100 MG/10ML IV SOLN
INTRAVENOUS | Status: AC
  Filled 2016-09-13: qty 1

## 2016-09-13 MED ORDER — KETOROLAC TROMETHAMINE 30 MG/ML IJ SOLN
INTRAMUSCULAR | Status: DC | PRN
  Administered 2016-09-13: 30 mg via INTRAVENOUS

## 2016-09-13 SURGICAL SUPPLY — 26 items
ADH SKN CLS APL DERMABOND .7 (GAUZE/BANDAGES/DRESSINGS) ×1
CATH ROBINSON RED A/P 16FR (CATHETERS) ×2 IMPLANT
CLIP FILSHIE TUBAL LIGA STRL (Clip) ×2 IMPLANT
CLOTH BEACON ORANGE TIMEOUT ST (SAFETY) ×2 IMPLANT
DERMABOND ADHESIVE PROPEN (GAUZE/BANDAGES/DRESSINGS) ×1
DERMABOND ADVANCED (GAUZE/BANDAGES/DRESSINGS) ×1
DERMABOND ADVANCED .7 DNX12 (GAUZE/BANDAGES/DRESSINGS) ×1 IMPLANT
DERMABOND ADVANCED .7 DNX6 (GAUZE/BANDAGES/DRESSINGS) ×1 IMPLANT
DRSG OPSITE POSTOP 3X4 (GAUZE/BANDAGES/DRESSINGS) ×2 IMPLANT
DURAPREP 26ML APPLICATOR (WOUND CARE) ×2 IMPLANT
GLOVE BIOGEL PI IND STRL 7.0 (GLOVE) ×3 IMPLANT
GLOVE BIOGEL PI INDICATOR 7.0 (GLOVE) ×3
GLOVE ECLIPSE 7.0 STRL STRAW (GLOVE) ×2 IMPLANT
GOWN STRL REUS W/TWL LRG LVL3 (GOWN DISPOSABLE) ×6 IMPLANT
NEEDLE INSUFFLATION 120MM (ENDOMECHANICALS) IMPLANT
PACK LAPAROSCOPY BASIN (CUSTOM PROCEDURE TRAY) ×2 IMPLANT
PACK TRENDGUARD 450 HYBRID PRO (MISCELLANEOUS) ×1 IMPLANT
PACK TRENDGUARD 600 HYBRD PROC (MISCELLANEOUS) IMPLANT
PROTECTOR NERVE ULNAR (MISCELLANEOUS) ×4 IMPLANT
SUT VIC AB 3-0 X1 27 (SUTURE) ×2 IMPLANT
SUT VICRYL 0 UR6 27IN ABS (SUTURE) ×2 IMPLANT
SYR 30ML LL (SYRINGE) ×2 IMPLANT
TOWEL OR 17X24 6PK STRL BLUE (TOWEL DISPOSABLE) ×4 IMPLANT
TRENDGUARD 450 HYBRID PRO PACK (MISCELLANEOUS) ×2
TRENDGUARD 600 HYBRID PROC PK (MISCELLANEOUS)
TROCAR XCEL NON-BLD 11X100MML (ENDOMECHANICALS) ×2 IMPLANT

## 2016-09-13 NOTE — Transfer of Care (Signed)
Immediate Anesthesia Transfer of Care Note  Patient: Traci Rice  Procedure(s) Performed: Procedure(s): LAPAROSCOPIC TUBAL LIGATION (Bilateral)  Patient Location: PACU  Anesthesia Type:General  Level of Consciousness: awake, alert  and oriented  Airway & Oxygen Therapy: Patient Spontanous Breathing and Patient connected to nasal cannula oxygen  Post-op Assessment: Report given to RN and Post -op Vital signs reviewed and stable  Post vital signs: Reviewed and stable  Last Vitals:  Vitals:   09/13/16 0817  BP: 112/82  Pulse: 81  Resp: 20  Temp: 36.8 C    Last Pain:  Vitals:   09/13/16 0817  TempSrc: Oral      Patients Stated Pain Goal: 6 (09/13/16 0817)  Complications: No apparent anesthesia complications

## 2016-09-13 NOTE — Anesthesia Preprocedure Evaluation (Signed)
Anesthesia Evaluation  Patient identified by MRN, date of birth, ID band Patient awake    Reviewed: Allergy & Precautions, H&P , NPO status , Patient's Chart, lab work & pertinent test results  Airway Mallampati: I  TM Distance: >3 FB Neck ROM: full    Dental no notable dental hx. (+) Teeth Intact   Pulmonary former smoker,    Pulmonary exam normal        Cardiovascular negative cardio ROS Normal cardiovascular exam     Neuro/Psych negative neurological ROS  negative psych ROS   GI/Hepatic Neg liver ROS,   Endo/Other  negative endocrine ROS  Renal/GU negative Renal ROS     Musculoskeletal   Abdominal Normal abdominal exam  (+)   Peds  Hematology negative hematology ROS (+)   Anesthesia Other Findings   Reproductive/Obstetrics negative OB ROS                             Anesthesia Physical Anesthesia Plan  ASA: II  Anesthesia Plan: General   Post-op Pain Management:    Induction: Intravenous  Airway Management Planned: Oral ETT  Additional Equipment:   Intra-op Plan:   Post-operative Plan: Extubation in OR  Informed Consent: I have reviewed the patients History and Physical, chart, labs and discussed the procedure including the risks, benefits and alternatives for the proposed anesthesia with the patient or authorized representative who has indicated his/her understanding and acceptance.   Dental Advisory Given  Plan Discussed with: CRNA and Surgeon  Anesthesia Plan Comments:         Anesthesia Quick Evaluation

## 2016-09-13 NOTE — Discharge Instructions (Addendum)
Laparoscopic Tubal Ligation, Care After Refer to this sheet in the next few weeks. These instructions provide you with information about caring for yourself after your procedure. Your health care provider may also give you more specific instructions. Your treatment has been planned according to current medical practices, but problems sometimes occur. Call your health care provider if you have any problems or questions after your procedure. What can I expect after the procedure? After the procedure, it is common to have:  A sore throat.  Discomfort in your shoulder.  Mild discomfort or cramping in your abdomen.  Gas pains.  Pain or soreness in the area where the surgical cut (incision) was made.  A bloated feeling.  Tiredness.  Nausea.  Vomiting. Follow these instructions at home: Medicines  Take over-the-counter and prescription medicines only as told by your health care provider.  Do not take aspirin because it can cause bleeding.  Do not drive or operate heavy machinery while taking prescription pain medicine. Activity  Rest for the rest of the day.  Return to your normal activities as told by your health care provider. Ask your health care provider what activities are safe for you. Incision care  Follow instructions from your health care provider about how to take care of your incision. Make sure you:  Wash your hands with soap and water before you change your bandage (dressing). If soap and water are not available, use hand sanitizer.  Change your dressing as told by your health care provider.  Leave stitches (sutures) in place. They may need to stay in place for 2 weeks or longer.  Check your incision area every day for signs of infection. Check for:  More redness, swelling, or pain.  More fluid or blood.  Warmth.  Pus or a bad smell. Other Instructions  Do not take baths, swim, or use a hot tub until your health care provider approves. You may take  showers.  Keep all follow-up visits as told by your health care provider. This is important.  Have someone help you with your daily household tasks for the first few days. Contact a health care provider if:  You have more redness, swelling, or pain around your incision.  Your incision feels warm to the touch.  You have pus or a bad smell coming from your incision.  The edges of your incision break open after the sutures have been removed.  Your pain does not improve after 2-3 days.  You have a rash.  You repeatedly become dizzy or light-headed.  Your pain medicine is not helping.  You are constipated. Get help right away if:  You have a fever.  You faint.  You have increasing pain in your abdomen.  You have severe pain in one or both of your shoulders.  You have fluid or blood coming from your sutures or from your vagina.  You have shortness of breath or difficulty breathing.  You have chest pain or leg pain.  You have ongoing nausea, vomiting, or diarrhea. This information is not intended to replace advice given to you by your health care provider. Make sure you discuss any questions you have with your health care provider. Document Released: 02/12/2005 Document Revised: 12/29/2015 Document Reviewed: 07/06/2015 Elsevier Interactive Patient Education  2017 [employer] DISCHARGE INSTRUCTIONS: Laparoscopy  The following instructions have been prepared to help you care for yourself upon your return home today.  Wound care:  Do not get the incision wet for the first 24 hours. The  incision should be kept clean and dry.  The Band-Aids or dressings may be removed the 2 days after surgery.  Should the incision become sore, red, and swollen after the first week, check with your doctor.  Personal hygiene:  Shower the day after your procedure.  Activity and limitations:  Do NOT drive or operate any equipment today.  Do NOT lift anything more than 15 pounds for  2-3 weeks after surgery.  Do NOT rest in bed all day.  Walking is encouraged. Walk each day, starting slowly with 5-minute walks 3 or 4 times a day. Slowly increase the length of your walks.  Walk up and down stairs slowly.  Do NOT do strenuous activities, such as golfing, playing tennis, bowling, running, biking, weight lifting, gardening, mowing, or vacuuming for 2-4 weeks. Ask your doctor when it is okay to start.  Diet: Eat a light meal as desired this evening. You may resume your usual diet tomorrow.  Return to work: This is dependent on the type of work you do. For the most part you can return to a desk job within a week of surgery. If you are more active at work, please discuss this with your doctor.  What to expect after your surgery: You may have a slight burning sensation when you urinate on the first day. You may have a very small amount of blood in the urine. Expect to have a small amount of vaginal discharge/light bleeding for 1-2 weeks. It is not unusual to have abdominal soreness and bruising for up to 2 weeks. You may be tired and need more rest for about 1 week. You may experience shoulder pain for 24-72 hours. Lying flat in bed may relieve it.  Call your doctor for any of the following:  Develop a fever of 100.4 or greater  Inability to urinate 6 hours after discharge from hospital  Severe pain not relieved by pain medications  Persistent of heavy bleeding at incision site  Redness or swelling around incision site after a week  Increasing nausea or vomiting  Patient Signature________________________________________ Nurse Signature_________________________________________   Post Anesthesia Home Care Instructions  Activity: Get plenty of rest for the remainder of the day. A responsible adult should stay with you for 24 hours following the procedure.  For the next 24 hours, DO NOT: -Drive a car -Advertising copywriter -Drink alcoholic beverages -Take any medication  unless instructed by your physician -Make any legal decisions or sign important papers.  Meals: Start with liquid foods such as gelatin or soup. Progress to regular foods as tolerated. Avoid greasy, spicy, heavy foods. If nausea and/or vomiting occur, drink only clear liquids until the nausea and/or vomiting subsides. Call your physician if vomiting continues.  Special Instructions/Symptoms: Your throat may feel dry or sore from the anesthesia or the breathing tube placed in your throat during surgery. If this causes discomfort, gargle with warm salt water. The discomfort should disappear within 24 hours.  If you had a scopolamine patch placed behind your ear for the management of post- operative nausea and/or vomiting:  1. The medication in the patch is effective for 72 hours, after which it should be removed.  Wrap patch in a tissue and discard in the trash. Wash hands thoroughly with soap and water. 2. You may remove the patch earlier than 72 hours if you experience unpleasant side effects which may include dry mouth, dizziness or visual disturbances. 3. Avoid touching the patch. Wash your hands with soap and water after contact  with the patch.

## 2016-09-13 NOTE — Op Note (Signed)
Traci Rice 09/13/2016  PREOPERATIVE DIAGNOSIS:  Undesired fertility  POSTOPERATIVE DIAGNOSIS:  Undesired fertility  PROCEDURE:  Laparoscopic Bilateral Tubal Sterilization using Filshie Clips   SURGEON: Jaynie CollinsUgonna Nashley Cordoba, MD  ANESTHESIA:  General endotracheal  COMPLICATIONS:  None immediate.  ESTIMATED BLOOD LOSS:  50 ml.  FLUIDS: 1400 ml LR.  INDICATIONS: 36 y.o. [id number]  with undesired fertility, desires permanent sterilization. Other reversible forms of contraception were discussed with patient; she declines all other modalities.  Risks of procedure discussed with patient including permanence of method, bleeding, infection, injury to surrounding organs and need for additional procedures including laparotomy, risk of regret.  Failure risk of 0.5-1% with increased risk of ectopic gestation if pregnancy occurs was also discussed with patient.      FINDINGS:  Normal uterus, tubes, and ovaries.  TECHNIQUE:  The patient was taken to the operating room where general anesthesia was obtained without difficulty.  She was then placed in the dorsal lithotomy position and prepared and draped in sterile fashion.  After an adequate timeout was performed, a bivalved speculum was then placed in the patient's vagina, and the anterior lip of cervix grasped with the single-tooth tenaculum.  The uterine manipulator was then advanced into the uterus.  The speculum was removed from the vagina.  Attention was then turned to the patient's abdomen where a 11-mm skin incision was made in the umbilical fold.  The Optiview 11-mm trocar and sleeve were then advanced without difficulty with the laparoscope under direct visualization into the abdomen.  The abdomen was then insufflated with carbon dioxide gas and adequate pneumoperitoneum was obtained.  A survey of the patient's pelvis and abdomen revealed entirely normal anatomy.  The fallopian tubes were observed and found to be normal in appearance. The Filshie clip  applicator was placed through the operative port, and a Filshie clip was placed on the right fallopian tube ,about 2 cm from the cornual attachment, with care given to incorporate the underlying mesosalpinx.  A similar process was carried out on the contralateral side allowing for bilateral tubal sterilization.   Good hemostasis was noted overall.  Local analgesia was drizzled on both operative sites.The instruments were then removed from the patient's abdomen and the fascial incision was repaired with 0 Vicryl, and the skin was closed with 3-0 Vicryl subcuticular stitch and Dermabond.  The uterine manipulator and the tenaculum were removed from the vagina without complications. The patient tolerated the procedure well.  Sponge, lap, and needle counts were correct times two.  The patient was then taken to the recovery room awake, extubated and in stable condition.   The patient will be discharged to home as per PACU criteria.  Routine postoperative instructions given.  She was prescribed Percocet, Ibuprofen and Colace.  She was also prescribed Diflucan as discussed for possible thrush infection/superficial nipple yeast infection.  She will follow up in the clinic in 2-3 weeks for postoperative evaluation.  Jaynie CollinsUGONNA  Angelyne Terwilliger, MD, FACOG Attending Obstetrician & Gynecologist, [facility], [facility]

## 2016-09-13 NOTE — H&P (View-Only) (Signed)
Subjective:     Traci Rice is a 36 y.o. 242P1102 female who presents for a postpartum visit. She is 6 weeks postpartum following a spontaneous vaginal delivery. I have fully reviewed the prenatal and intrapartum course. The delivery was at 4460w2d gestational weeks. Outcome: spontaneous vaginal delivery. Anesthesia: none. Postpartum course has been uncomplicated. Baby's course has been uncomplicated. Baby is feeding by breast. Bleeding no bleeding. Bowel function is normal. Bladder function is normal. Patient is not sexually active. Planned contraception method is tubal ligation; condoms in the interim.  Postpartum depression screening: negative. Reports itchy, vaginal discharge for a few weeks.  The following portions of the patient's history were reviewed and updated as appropriate: allergies, current medications, past family history, past medical history, past social history, past surgical history and problem list.  Review of Systems Pertinent items noted in HPI and remainder of comprehensive ROS otherwise negative.   Objective:    BP 98/65 (BP Location: Left Arm, Patient Position: Sitting, Cuff Size: Normal)   Pulse 86   Resp 18   Ht 5\' 4"  (1.626 m)   Wt 172 lb (78 kg)   LMP 08/08/2016   Breastfeeding? Yes   BMI 29.52 kg/m   General:  alert and no distress   Breasts:  inspection negative, no nipple discharge or bleeding, no masses or nodularity palpable  Lungs: clear to auscultation bilaterally  Heart:  regular rate and rhythm  Abdomen: soft, non-tender; bowel sounds normal; no masses,  no organomegaly   Vulva:  normal and scant thin white discharge seen. Wet prep obtained.  Vagina: normal vagina  Cervix:  not evaluated  Corpus: not examined  Adnexa:  not evaluated  Rectal Exam: Not performed.        Assessment and Plan:   1. Routine postpartum follow-up Normal postpartum exam  2. Vaginal discharge - Cervicovaginal ancillary done, will follow up results and manage  accordingly.  3. Consultation for female sterilization Patient desires bilateral tubal sterilization.  Other reversible forms of contraception were discussed with patient; she declines all other modalities. Discussed bilateral tubal sterilization in detail; discussed options of laparoscopic bilateral tubal sterilization using Filshie clips. Risks and benefits discussed in detail including but not limited to: risk of regret, permanence of method, bleeding, infection, injury to surrounding organs and need for additional procedures.  Failure risk of 1-2 % for Filshie clips with increased risk of ectopic gestation if pregnancy occurs was also discussed with patient.  Patient verbalized understanding of these risks and benefits and wants to proceed with sterilization with laparoscopic bilateral sterilization using Filshie clips.  She was told that she will be contacted by our surgical scheduler regarding the time and date of her surgery; routine preoperative instructions of having nothing to eat or drink after midnight on the day prior to surgery and also coming to the hospital 1 1/2 hours prior to her time of surgery were also emphasized.  She was told she may be called for a preoperative appointment about a week prior to surgery and will be given further preoperative instructions at that visit.  Routine postoperative instructions will be reviewed with the patient and her family in detail after surgery. Printed patient education handouts about the procedure was given to the patient to review at home.  In the meantime, patient will use condoms for contraception prior to surgery.  Routine preventative health maintenance measures emphasized.   Jaynie CollinsUGONNA  Eartha Vonbehren, MD, FACOG Attending Obstetrician & Gynecologist, [facility], [facility]

## 2016-09-13 NOTE — Telephone Encounter (Signed)
Called and left voicemail on cell phone to schedule her postop f/u for tubal on (09/13/16) for 2-3 weeks per Dr Macon LargeAnyanwu.Marland Kitchen..Marland Kitchen

## 2016-09-13 NOTE — Anesthesia Postprocedure Evaluation (Addendum)
Anesthesia Post Note  Patient: Traci Rice  Procedure(s) Performed: Procedure(s) (LRB): LAPAROSCOPIC TUBAL LIGATION (Bilateral)  Patient location during evaluation: PACU Anesthesia Type: General Level of consciousness: awake Pain management: pain level controlled Vital Signs Assessment: post-procedure vital signs reviewed and stable Respiratory status: spontaneous breathing Cardiovascular status: stable Postop Assessment: no signs of nausea or vomiting Anesthetic complications: no        Last Vitals:  Vitals:   09/13/16 1130 09/13/16 1223  BP: 123/81 107/66  Pulse: 62 80  Resp: 17 16  Temp: 36.8 C 36.8 C    Last Pain:  Vitals:   09/13/16 1130  TempSrc:   PainSc: 0-No pain   Pain Goal: Patients Stated Pain Goal: 6 (09/13/16 1130)               Lerry Cordrey JR,JOHN Susann GivensFRANKLIN

## 2016-09-13 NOTE — Anesthesia Procedure Notes (Signed)
Procedure Name: Intubation Date/Time: 09/13/2016 9:32 AM Performed by: Flossie Dibble Pre-anesthesia Checklist: Patient identified, Patient being monitored, Timeout performed, Emergency Drugs available and Suction available Patient Re-evaluated:Patient Re-evaluated prior to inductionOxygen Delivery Method: Circle System Utilized Preoxygenation: Pre-oxygenation with 100% oxygen Intubation Type: IV induction Ventilation: Mask ventilation without difficulty Laryngoscope Size: Mac and 3 Grade View: Grade I Tube type: Oral Tube size: 7.0 mm Number of attempts: 1 Airway Equipment and Method: stylet and Stylet Placement Confirmation: ETT inserted through vocal cords under direct vision,  positive ETCO2 and breath sounds checked- equal and bilateral Secured at: 21 cm Tube secured with: Tape Dental Injury: Teeth and Oropharynx as per pre-operative assessment

## 2016-09-13 NOTE — Interval H&P Note (Signed)
History and Physical Interval Note 09/13/2016 8:33 AM  Traci Rice  has presented today for surgery, with the diagnosis of undesired fertility  The various methods of treatment have been discussed with the patient and family. After consideration of risks, benefits and other options for treatment, the patient has consented to LAPAROSCOPIC TUBAL LIGATION (Bilateral) as a surgical intervention.  The patient's history has been reviewed, patient examined, no change in status, stable for surgery.  Of note, patient reported her infant having thrush and having mild candidal infection of her nipples. Her breasts were examined, no erythema or induration noted and mild tenderness to palpation. Will give some Diflucan; she was told to call for worsening symptoms as she may need prolonged course of antifungals.   I have reviewed the patient's chart and labs.  Questions were answered to the patient's satisfaction. To OR when ready.   Jaynie CollinsUGONNA  Sabian Kuba, MD, FACOG Attending Obstetrician & Gynecologist, [facility], [facility]

## 2016-09-14 ENCOUNTER — Encounter (HOSPITAL_COMMUNITY): Payer: Self-pay | Admitting: Obstetrics & Gynecology

## 2016-09-17 ENCOUNTER — Telehealth: Payer: Self-pay | Admitting: *Deleted

## 2016-09-17 DIAGNOSIS — B3789 Other sites of candidiasis: Secondary | ICD-10-CM

## 2016-09-17 DIAGNOSIS — O9102 Infection of nipple associated with the puerperium: Principal | ICD-10-CM

## 2016-09-17 MED ORDER — NYSTATIN 100000 UNIT/GM EX CREA: 1.0000 "application " | TOPICAL_CREAM | Freq: Three times a day (TID) | CUTANEOUS | 0 refills | Status: DC

## 2016-09-17 NOTE — Telephone Encounter (Signed)
Pt is currently just pumping her breast milk, sent Nystatin cream to the pharmacy for her to apply when pumping to help with yeast on the nipple.  Reviewed mastitis precautions with the patient.

## 2016-09-17 NOTE — Telephone Encounter (Signed)
-----   Message from Lindell SparHeather L Bacon, [place] sent at 09/17/2016 10:25 AM EST ----- Regarding: Traci [name]  Contact: [phone] Call back about yeast on nipples and extreme nipple pain while pumping,

## 2016-09-28 ENCOUNTER — Encounter: Payer: Self-pay | Admitting: Obstetrics & Gynecology

## 2016-09-28 ENCOUNTER — Ambulatory Visit (INDEPENDENT_AMBULATORY_CARE_PROVIDER_SITE_OTHER): Payer: Managed Care, Other (non HMO) | Admitting: Obstetrics & Gynecology

## 2016-09-28 VITALS — BP 102/70 | HR 80 | Resp 18 | Wt 171.0 lb

## 2016-09-28 DIAGNOSIS — Z09 Encounter for follow-up examination after completed treatment for conditions other than malignant neoplasm: Secondary | ICD-10-CM

## 2016-09-28 NOTE — Progress Notes (Signed)
   Subjective:     Traci Rice is a 36 y.o. 622P1102 female who presents to the clinic 2 weeks status post laparoscopic bilateral tubal sterilization for undesired fertility. Eating a regular diet without difficulty. Bowel movements are normal. The patient is not having any pain.  The following portions of the patient's history were reviewed and updated as appropriate: allergies, current medications, past family history, past medical history, past social history, past surgical history and problem list. Normal pap and negative HRHPV on 05/23/2015.  Review of Systems Pertinent items noted in HPI and remainder of comprehensive ROS otherwise negative.    Objective:    BP 102/70 (BP Location: Left Arm, Patient Position: Sitting, Cuff Size: Normal)   Pulse 80   Resp 18   Wt 171 lb (77.6 kg)   LMP 08/08/2016   Breastfeeding? Yes   BMI 29.12 kg/m  General:  alert and no distress  Abdomen: soft, bowel sounds active, non-tender  Incision:   healing well, no drainage, no erythema, no hernia, no seroma, no swelling, no dehiscence, incision well approximated     Assessment:   Doing well postoperatively.   Plan:   1. Continue any current medications. 2. Wound care discussed. 3. Activity restrictions: none 4. Anticipated return to work: now. 5. Follow up as needed.   Jaynie CollinsUGONNA  Jacqualynn Parco, MD, FACOG Attending Obstetrician & Gynecologist, [facility], [facility]

## 2016-09-28 NOTE — Patient Instructions (Signed)
Return to clinic for any scheduled appointments or for any gynecologic concerns as needed.   

## 2016-10-07 ENCOUNTER — Encounter: Payer: Self-pay | Admitting: Internal Medicine

## 2016-10-07 ENCOUNTER — Ambulatory Visit (INDEPENDENT_AMBULATORY_CARE_PROVIDER_SITE_OTHER): Payer: 59 | Admitting: Internal Medicine

## 2016-10-07 VITALS — BP 110/70 | HR 81 | Temp 97.5°F | Ht 64.0 in | Wt 175.0 lb

## 2016-10-07 DIAGNOSIS — R000 Tachycardia, unspecified: Secondary | ICD-10-CM

## 2016-10-07 DIAGNOSIS — R002 Palpitations: Secondary | ICD-10-CM | POA: Diagnosis not present

## 2016-10-07 LAB — CBC WITH DIFFERENTIAL/PLATELET
BASOS PCT: 0 %
Basophils Absolute: 0 cells/uL (ref 0–200)
EOS PCT: 1 %
Eosinophils Absolute: 69 cells/uL (ref 15–500)
HEMATOCRIT: 39.8 % (ref 35.0–45.0)
Hemoglobin: 13.6 g/dL (ref 11.7–15.5)
LYMPHS ABS: 2208 {cells}/uL (ref 850–3900)
LYMPHS PCT: 32 %
MCH: 30.2 pg (ref 27.0–33.0)
MCHC: 34.2 g/dL (ref 32.0–36.0)
MCV: 88.4 fL (ref 80.0–100.0)
MONO ABS: 690 {cells}/uL (ref 200–950)
MPV: 10.7 fL (ref 7.5–12.5)
Monocytes Relative: 10 %
Neutro Abs: 3933 cells/uL (ref 1500–7800)
Neutrophils Relative %: 57 %
Platelets: 271 10*3/uL (ref 140–400)
RBC: 4.5 MIL/uL (ref 3.80–5.10)
RDW: 13.4 % (ref 11.0–15.0)
WBC: 6.9 10*3/uL (ref 3.8–10.8)

## 2016-10-07 LAB — T4, FREE: FREE T4: 1 ng/dL (ref 0.8–1.8)

## 2016-10-07 LAB — TSH: TSH: 0.52 mIU/L

## 2016-10-12 NOTE — Progress Notes (Signed)
   Subjective:    Patient ID: Traci Rice, female    DOB: 07/31/1981, 36 y.o.   MRN: [id number]  HPI 36 year old Female whose general health is been excellent in today complaining of palpitations and tachycardia. Has had some headache and shortness of breath. Has had multiple episodes of the past few days. Does not know what triggers the episodes. Denies being under excessive stress. Has 2 children. Denies consuming excessive caffeine, taking over-the-counter decongestants.    Review of Systems see above     Objective:   Physical Exam Neck is supple without JVD thyromegaly or carotid bruits. Chest clear to auscultation. Cardiac exam regular rate and rhythm normal S1 and S2 without murmurs gallops or clicks. Extremities without edema.       Assessment & Plan:  Palpitations-etiology unclear  Plan: EKG is within normal limits. CBC, free T4 and TSH are within normal limits.  Plan: Patient will have 24-hour Holter monitor. She'll have 2-D echocardiogram and we will reassess after these studies are completed.

## 2016-10-12 NOTE — Patient Instructions (Addendum)
Patient to have 24-hour Holter monitor and 2-D echocardiogram. Thyroid functions and CBC checked.

## 2016-10-13 DIAGNOSIS — R55 Syncope and collapse: Secondary | ICD-10-CM | POA: Insufficient documentation

## 2016-10-13 NOTE — Progress Notes (Deleted)
Cardiology Office Note   Date:  10/13/2016   ID:  Traci Rice, DOB [birth year], MRN [id number]  PCP:  Margaree Mackintosh, MD  Cardiologist:   Charlton Haws, MD   No chief complaint on file.     History of Present Illness: Traci Rice is a 36 y.o. female who presents for palpitations. Seen by primary 10/07/16 Associated with some headache and dyspnea Multiple episodes last week. No apparent trigger. No stimulants or excess caffeine. Echo and holter monitor ordered but not done yet.   Labs including TSH and Hct normal 10/07/16 reviewed.    Past Medical History:  Diagnosis Date  . Abnormal Pap smear   . Attention deficit disorder (ADD) 2013  . Cold extremities    bilateral lower  . 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 GERD (gastroesophageal reflux disease)   . Overweight (BMI 25.0-29.9)   . Syncope    teenager    Past Surgical History:  Procedure Laterality Date  . LAPAROSCOPIC TUBAL LIGATION Bilateral 09/13/2016   Procedure: LAPAROSCOPIC TUBAL LIGATION;  Surgeon: Tereso Newcomer, MD;  Location: [facility];  Service: Gynecology;  Laterality: Bilateral;  . laporoscopy     remove cyst from left ovary  . LEEP    . OVARIAN CYST REMOVAL    . WISDOM TOOTH EXTRACTION       Current Outpatient Prescriptions  Medication Sig Dispense Refill  . ibuprofen (ADVIL,MOTRIN) 600 MG tablet Take 600 mg by mouth every 6 (six) hours as needed.    . Prenatal Vit-Fe Fumarate-FA (MULTIVITAMIN-PRENATAL) 27-0.8 MG TABS tablet Take 1 tablet by mouth daily at 12 noon.     No current facility-administered medications for this visit.     Allergies:   Hydrocodone    Social History:  The patient  reports that she quit smoking about 4 years ago. Her smoking use included Cigarettes. She has never used smokeless tobacco. She reports that she does not drink alcohol or use drugs.   Family History:  The patient's family history includes Cancer in her paternal grandmother; Diabetes in her maternal grandmother,  mother, and paternal grandmother; Heart disease in her paternal aunt; Heart murmur in her brother; Kidney failure in her maternal grandmother; Stroke in her paternal grandfather.    ROS:  Please see the history of present illness.   Otherwise, review of systems are positive for {NONE DEFAULTED:18576::"none"}.   All other systems are reviewed and negative.    PHYSICAL EXAM: VS:  There were no vitals taken for this visit. , BMI There is no height or weight on file to calculate BMI. Affect appropriate Healthy:  appears stated age HEENT: normal Neck supple with no adenopathy JVP normal no bruits no thyromegaly Lungs clear with no wheezing and good diaphragmatic motion Heart:  S1/S2 no murmur, no rub, gallop or click PMI normal Abdomen: benighn, BS positve, no tenderness, no AAA no bruit.  No HSM or HJR Distal pulses intact with no bruits No edema Neuro non-focal Skin warm and dry No muscular weakness    EKG:  10/07/16 SR rate 82 normal    Recent Labs: 10/07/2016: Hemoglobin 13.6; Platelets 271; TSH 0.52    Lipid Panel    Component Value Date/Time   CHOL 152 05/22/2015 0910   TRIG 51 05/22/2015 0910   HDL 56 05/22/2015 0910   CHOLHDL 2.7 05/22/2015 0910   VLDL 10 05/22/2015 0910   LDLCALC 86 05/22/2015 0910      Wt Readings from Last 3 Encounters:  10/07/16  175 lb (79.4 kg)  09/28/16 171 lb (77.6 kg)  09/09/16 172 lb (78 kg)      Other studies Reviewed: Additional studies/ records that were reviewed today include: Notes Dr Lenord FellersBaxley labs and ECG .    ASSESSMENT AND PLAN:  1.  Palpitations   Current medicines are reviewed at length with the patient today.  The patient does not have concerns regarding medicines.  The following changes have been made:  ***  Labs/ tests ordered today include: *** No orders of the defined types were placed in this encounter.    Disposition:   FU with ***     Signed, Charlton HawsPeter Eula Mazzola, MD  10/13/2016 2:46 PM    [facility] [address], [place], [place]  [address] Phone: [phone]; Fax: [phone]

## 2016-10-14 ENCOUNTER — Ambulatory Visit: Payer: Managed Care, Other (non HMO) | Admitting: Cardiovascular Disease

## 2016-10-24 NOTE — Progress Notes (Signed)
Cardiology Office Note   Date:  10/25/2016   ID:  Rice Traci, DOB 1981-05-12, MRN [id number]  PCP:  Traci Mackintosh, MD  Cardiologist:   Rollene Rotunda, MD  Referring:  Traci Mackintosh, MD    Chief Complaint  Patient presents with  . Palpitations      History of Present Illness: Traci Rice is a 36 y.o. female who presents for evaluation of palpitations and SOB.  She was referred by Dr. Lenord Rice .  She has no past cardiac history.  She has noticed palpitations. These have been going on for about 4 weeks was. He worked daily although they are less bothersome more recently.  She feels skipped beat. If there is any association might be what she is nursing. However, they seem to happen sporadically. She'll have a little chest pressure with them. They might last for up to a minute. She's not had any presyncope or syncope. There's been no jaw or arm discomfort. She is active in care of her 2 young children and going up and down stairs. With this she cannot bring on any symptoms. She denies any new shortness of breath, PND or orthopnea. He's had no presyncope or syncope. She does drink a little bit of caffeine but has done this chronically.   Past Medical History:  Diagnosis Date  . Abnormal Pap smear   . Attention deficit disorder (ADD) 2013  . 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 GERD (gastroesophageal reflux disease)   . Overweight (BMI 25.0-29.9)   . Syncope    teenager    Past Surgical History:  Procedure Laterality Date  . LAPAROSCOPIC TUBAL LIGATION Bilateral 09/13/2016   Procedure: LAPAROSCOPIC TUBAL LIGATION;  Surgeon: Tereso Newcomer, MD;  Location: [facility];  Service: Gynecology;  Laterality: Bilateral;  . laporoscopy     remove cyst from left ovary  . LEEP    . OVARIAN CYST REMOVAL    . WISDOM TOOTH EXTRACTION       Current Outpatient Prescriptions  Medication Sig Dispense Refill  . Prenatal Vit-Fe Fumarate-FA (MULTIVITAMIN-PRENATAL) 27-0.8 MG TABS tablet Take 1  tablet by mouth daily at 12 noon.     No current facility-administered medications for this visit.     Allergies:   Hydrocodone    Social History:  The patient  reports that she quit smoking about 4 years ago. Her smoking use included Cigarettes. She has never used smokeless tobacco. She reports that she does not drink alcohol or use drugs.   Family History:  The patient's family history includes Cancer in her paternal grandmother; Diabetes in her maternal grandmother, mother, and paternal grandmother; Heart disease in her paternal aunt; Heart murmur in her brother; Kidney failure in her maternal grandmother; Stroke in her paternal grandfather.    ROS:  Please see the history of present illness.   Otherwise, review of systems are positive for none.   All other systems are reviewed and negative.    PHYSICAL EXAM: VS:  BP 100/63   Pulse 84   Ht 5\' 4"  (1.626 m)   Wt 174 lb 9.6 oz (79.2 kg)   BMI 29.97 kg/m  , BMI Body mass index is 29.97 kg/m. GENERAL:  Well appearing NECK:  No jugular venous distention, waveform within normal limits, carotid upstroke brisk and symmetric, no bruits, no thyromegaly LYMPHATICS:  No cervical, inguinal adenopathy LUNGS:  Clear to auscultation bilaterally BACK:  No CVA tenderness CHEST:  Unremarkable HEART:  PMI not displaced or sustained,S1  and S2 within normal limits, no S3, no S4, no clicks, no rubs, no murmurs ABD:  Flat, positive bowel sounds normal in frequency in pitch, no bruits, no rebound, no guarding, no midline pulsatile mass, no hepatomegaly, no splenomegaly EXT:  2 plus pulses throughout, no edema, no cyanosis no clubbing SKIN:  No rashes no nodules NEURO:  Cranial nerves II through XII grossly intact, motor grossly intact throughout PSYCH:  Cognitively intact, oriented to person place and time    EKG:  EKG is not ordered today. The ekg ordered 10/08/15 demonstrates sinus rhythm, rate 82, axis within normal limits, intervals within normal  limits, no acute ST-T wave changes.   Recent Labs: 10/07/2016: Hemoglobin 13.6; Platelets 271; TSH 0.52 10/25/2016: BUN 14; Creat 0.58; Magnesium 2.1; Potassium 4.4; Sodium 140    Lipid Panel    Component Value Date/Time   CHOL 152 05/22/2015 0910   TRIG 51 05/22/2015 0910   HDL 56 05/22/2015 0910   CHOLHDL 2.7 05/22/2015 0910   VLDL 10 05/22/2015 0910   LDLCALC 86 05/22/2015 0910      Wt Readings from Last 3 Encounters:  10/25/16 174 lb 9.6 oz (79.2 kg)  10/07/16 175 lb (79.4 kg)  09/28/16 171 lb (77.6 kg)      Other studies Reviewed: Additional studies/ records that were reviewed today include: EKG.  TSH and labs. Review of the above records demonstrates:  Please see elsewhere in the note.     ASSESSMENT AND PLAN:  PALPITATIONS:  She has a normal physical exam and unremarkable EKG. Her symptoms are actually improved. She has no high-risk physical findings or historical findings with a negative family history. I think these most likely represent PACs or PVCs. She did have a normal TSH but I like to get a basic metabolic profile and magnesium.   Current medicines are reviewed at length with the patient today.  The patient does not have concerns regarding medicines.  The following changes have been made:  no change  Labs/ tests ordered today include:   Orders Placed This Encounter  Procedures  . Magnesium  . Basic Metabolic Panel (BMET)     Disposition:   FU with me as needed.     Signed, Rollene RotundaJames Kelly Eisler, MD  10/25/2016 2:50 PM    [facility]

## 2016-10-25 ENCOUNTER — Encounter: Payer: Self-pay | Admitting: Cardiology

## 2016-10-25 ENCOUNTER — Ambulatory Visit (INDEPENDENT_AMBULATORY_CARE_PROVIDER_SITE_OTHER): Payer: 59 | Admitting: Cardiology

## 2016-10-25 VITALS — BP 100/63 | HR 84 | Ht 64.0 in | Wt 174.6 lb

## 2016-10-25 DIAGNOSIS — R002 Palpitations: Secondary | ICD-10-CM | POA: Diagnosis not present

## 2016-10-25 LAB — BASIC METABOLIC PANEL
BUN: 14 mg/dL (ref 7–25)
CALCIUM: 9.7 mg/dL (ref 8.6–10.2)
CHLORIDE: 106 mmol/L (ref 98–110)
CO2: 30 mmol/L (ref 20–31)
CREATININE: 0.58 mg/dL (ref 0.50–1.10)
Glucose, Bld: 87 mg/dL (ref 65–99)
Potassium: 4.4 mmol/L (ref 3.5–5.3)
SODIUM: 140 mmol/L (ref 135–146)

## 2016-10-25 LAB — MAGNESIUM: MAGNESIUM: 2.1 mg/dL (ref 1.5–2.5)

## 2016-10-25 NOTE — Progress Notes (Signed)
Thanks, she needed some reassurance.

## 2016-10-25 NOTE — Patient Instructions (Addendum)
Medication Instructions:  Continue current medications  Labwork: BMP and Magnesium   Testing/Procedures: None Ordered  Follow-Up: Your physician recommends that you schedule a follow-up appointment in: As Needed    Any Other Special Instructions Will Be Listed Below (If Applicable).   If you need a refill on your cardiac medications before your next appointment, please call your pharmacy.

## 2016-11-06 ENCOUNTER — Other Ambulatory Visit: Payer: Self-pay | Admitting: Obstetrics & Gynecology

## 2017-01-12 ENCOUNTER — Other Ambulatory Visit: Payer: Self-pay | Admitting: Obstetrics & Gynecology

## 2017-01-14 NOTE — Addendum Note (Signed)
Addendum  created 01/14/17 0947 by Sadye Kiernan, MD   Sign clinical note    

## 2017-01-18 ENCOUNTER — Telehealth: Payer: Self-pay | Admitting: Internal Medicine

## 2017-01-18 NOTE — Telephone Encounter (Signed)
Called the patient and LVM to schedule her echo.

## 2017-05-20 ENCOUNTER — Encounter: Payer: Self-pay | Admitting: Internal Medicine

## 2017-06-09 DIAGNOSIS — Z01419 Encounter for gynecological examination (general) (routine) without abnormal findings: Secondary | ICD-10-CM | POA: Diagnosis not present

## 2017-06-09 DIAGNOSIS — Z6828 Body mass index (BMI) 28.0-28.9, adult: Secondary | ICD-10-CM | POA: Diagnosis not present

## 2017-06-24 ENCOUNTER — Other Ambulatory Visit: Payer: BLUE CROSS/BLUE SHIELD | Admitting: Internal Medicine

## 2017-06-24 DIAGNOSIS — E663 Overweight: Secondary | ICD-10-CM

## 2017-06-24 DIAGNOSIS — Z1321 Encounter for screening for nutritional disorder: Secondary | ICD-10-CM

## 2017-06-24 DIAGNOSIS — Z0000 Encounter for general adult medical examination without abnormal findings: Secondary | ICD-10-CM

## 2017-06-24 DIAGNOSIS — Z130 Encounter for screening for diseases of the blood and blood-forming organs and certain disorders involving the immune mechanism: Secondary | ICD-10-CM

## 2017-06-24 DIAGNOSIS — Z1329 Encounter for screening for other suspected endocrine disorder: Secondary | ICD-10-CM

## 2017-06-24 DIAGNOSIS — Z13220 Encounter for screening for lipoid disorders: Secondary | ICD-10-CM

## 2017-06-25 LAB — COMPLETE METABOLIC PANEL WITH GFR
AG Ratio: 1.8 (calc) (ref 1.0–2.5)
ALBUMIN MSPROF: 4.4 g/dL (ref 3.6–5.1)
ALKALINE PHOSPHATASE (APISO): 72 U/L (ref 33–115)
ALT: 9 U/L (ref 6–29)
AST: 12 U/L (ref 10–30)
BILIRUBIN TOTAL: 0.6 mg/dL (ref 0.2–1.2)
BUN: 11 mg/dL (ref 7–25)
CHLORIDE: 104 mmol/L (ref 98–110)
CO2: 28 mmol/L (ref 20–32)
Calcium: 9.3 mg/dL (ref 8.6–10.2)
Creat: 0.57 mg/dL (ref 0.50–1.10)
GFR, Est African American: 139 mL/min/{1.73_m2} (ref >=60)
GFR, Est Non African American: 120 mL/min/{1.73_m2} (ref >=60)
GLUCOSE: 85 mg/dL (ref 65–99)
Globulin: 2.5 g/dL (calc) (ref 1.9–3.7)
Potassium: 4.4 mmol/L (ref 3.5–5.3)
Sodium: 138 mmol/L (ref 135–146)
Total Protein: 6.9 g/dL (ref 6.1–8.1)

## 2017-06-25 LAB — LIPID PANEL
Cholesterol: 174 mg/dL (ref <200)
HDL: 58 mg/dL (ref >50)
LDL CHOLESTEROL (CALC): 95 mg/dL
NON-HDL CHOLESTEROL (CALC): 116 mg/dL (ref <130)
Total CHOL/HDL Ratio: 3 (calc) (ref <5.0)
Triglycerides: 115 mg/dL (ref <150)

## 2017-06-25 LAB — CBC WITH DIFFERENTIAL/PLATELET
BASOS ABS: 28 {cells}/uL (ref 0–200)
Basophils Relative: 0.4 %
EOS ABS: 78 {cells}/uL (ref 15–500)
EOS PCT: 1.1 %
HCT: 41.1 % (ref 35.0–45.0)
HEMOGLOBIN: 13.7 g/dL (ref 11.7–15.5)
Lymphs Abs: 1967 cells/uL (ref 850–3900)
MCH: 29.3 pg (ref 27.0–33.0)
MCHC: 33.3 g/dL (ref 32.0–36.0)
MCV: 88 fL (ref 80.0–100.0)
MONOS PCT: 8.3 %
MPV: 11.2 fL (ref 7.5–12.5)
NEUTROS PCT: 62.5 %
Neutro Abs: 4438 cells/uL (ref 1500–7800)
PLATELETS: 253 10*3/uL (ref 140–400)
RBC: 4.67 10*6/uL (ref 3.80–5.10)
RDW: 12.2 % (ref 11.0–15.0)
TOTAL LYMPHOCYTE: 27.7 %
WBC mixed population: 589 cells/uL (ref 200–950)
WBC: 7.1 10*3/uL (ref 3.8–10.8)

## 2017-06-25 LAB — TSH: TSH: 0.34 mIU/L — ABNORMAL LOW

## 2017-06-25 LAB — VITAMIN D 25 HYDROXY (VIT D DEFICIENCY, FRACTURES): Vit D, 25-Hydroxy: 30 ng/mL (ref 30–100)

## 2017-06-27 ENCOUNTER — Encounter: Payer: Self-pay | Admitting: Internal Medicine

## 2017-06-27 ENCOUNTER — Ambulatory Visit (INDEPENDENT_AMBULATORY_CARE_PROVIDER_SITE_OTHER): Payer: BLUE CROSS/BLUE SHIELD | Admitting: Internal Medicine

## 2017-06-27 VITALS — BP 100/64 | HR 95 | Temp 99.0°F | Ht 65.25 in | Wt 172.0 lb

## 2017-06-27 DIAGNOSIS — N943 Premenstrual tension syndrome: Secondary | ICD-10-CM | POA: Diagnosis not present

## 2017-06-27 DIAGNOSIS — Z23 Encounter for immunization: Secondary | ICD-10-CM | POA: Diagnosis not present

## 2017-06-27 DIAGNOSIS — Z0000 Encounter for general adult medical examination without abnormal findings: Secondary | ICD-10-CM

## 2017-06-27 DIAGNOSIS — F419 Anxiety disorder, unspecified: Secondary | ICD-10-CM

## 2017-06-27 DIAGNOSIS — R7989 Other specified abnormal findings of blood chemistry: Secondary | ICD-10-CM

## 2017-06-27 LAB — POCT URINALYSIS DIPSTICK
BILIRUBIN UA: NEGATIVE
Glucose, UA: NEGATIVE
LEUKOCYTES UA: NEGATIVE
NITRITE UA: NEGATIVE
RBC UA: NEGATIVE
Spec Grav, UA: >=1.03 — AB (ref 1.010–1.025)
Urobilinogen, UA: 0.2 E.U./dL
pH, UA: 7 (ref 5.0–8.0)

## 2017-06-27 MED ORDER — ALPRAZOLAM 0.25 MG PO TABS: 0.2500 mg | ORAL_TABLET | Freq: Two times a day (BID) | ORAL | 0 refills | Status: DC | PRN

## 2017-06-27 NOTE — Patient Instructions (Signed)
To have TSH repeated in 6 months with office visit.  Take Xanax sparingly for PMS.  Flu vaccine given today.

## 2017-06-27 NOTE — Progress Notes (Signed)
Subjective:    Patient ID: Traci Rice, female    DOB: 10/07/1980, 36 y.o.   MRN: [id number]  HPI 36 year old Female for health maintenance exam and evaluation of medical issues.  Last seen in March 2018 for palpitations and referred to cardiology.  EKG was unremarkable.  It was felt that she likely had PACs or PVCs.  TSH was normal.  Magnesium was normal.  Basic metabolic panel was normal.  Cardiac exam was normal.  2D echocardiogram and Holter monitor were ordered but were not done.  This seem to be associated with breast-feeding.  Patient denies being stressed.  Symptoms resolved.  Lab work reviewed today.  Lipid panel is normal.  Vitamin D is normal.  However her TSH is low at 0.34.  Previously was normal at 0.52 and will be repeated in 6 months.  Not sure why it is low.  Could be an error.  Social history: She has a fianc.  She has 2 children.  Corliss ParishYoungest is a year old .  Oldest is 35 years old.  7-year monogamous relationship.  [place].  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  She is employed full-time in HR position and another business.  Family history: Maternal grandmother and maternal grandmother with history of diabetes mellitus.  Mother with history of diabetes.  No known drug allergies.  Initially presented here in 2000 for tonsillitis.  Had vasovagal syncope 2001.  History of smoking in 2003 but no longer smokes.  Had LEEP procedure about 10 years ago.  Had tubal ligation after birth of second child.  Flu vaccine given   Review of Systems history of anxiety and attention deficit issues.  Used to take attention deficit medication.  Recently has developed symptoms consistent with PMS.  Would like to have some Xanax on hand for that.     Objective:   Physical Exam  Constitutional: She is oriented to person, place, and time. She appears well-developed and well-nourished. No distress.  HENT:  Head: Normocephalic and atraumatic.  Right Ear: External  ear normal.  Left Ear: External ear normal.  Mouth/Throat: Oropharynx is clear and moist.  Eyes: Conjunctivae and EOM are normal. Pupils are equal, round, and reactive to light. Right eye exhibits no discharge. Left eye exhibits no discharge. No scleral icterus.  Neck: Neck supple. No JVD present. No thyromegaly present.  Cardiovascular: Normal rate, regular rhythm, normal heart sounds and intact distal pulses.  No murmur heard. Pulmonary/Chest:  Breasts normal female without masses  Abdominal: Soft. Bowel sounds are normal. She exhibits no distension and no mass. There is no tenderness. There is no rebound and no guarding.  Genitourinary:  Genitourinary Comments: Deferred to GYN  Musculoskeletal: She exhibits no edema.  Lymphadenopathy:    She has no cervical adenopathy.  Neurological: She is alert and oriented to person, place, and time. She has normal reflexes. No cranial nerve deficit.  Skin: Skin is warm and dry. No rash noted. She is not diaphoretic.  Psychiatric: She has a normal mood and affect. Her behavior is normal. Judgment and thought content normal.  Vitals reviewed.         Assessment & Plan:  History of anxiety-used to take Zoloft but no longer does so  History of attention deficit disorder-currently not on medication  Low TSH-recheck in 6 months  ?  Error-no symptoms of hyperthyroidism at present time  Remote history of palpitations-resolved  PMS-try small dose of Xanax up to twice daily during  menstrual.  And before.  Flu vaccine given  Plan: Follow-up in 6 months with TSH and office visit.

## 2017-07-03 ENCOUNTER — Encounter: Payer: Self-pay | Admitting: Internal Medicine

## 2017-07-25 ENCOUNTER — Other Ambulatory Visit: Payer: Self-pay | Admitting: Obstetrics and Gynecology

## 2017-11-30 DIAGNOSIS — M9902 Segmental and somatic dysfunction of thoracic region: Secondary | ICD-10-CM | POA: Diagnosis not present

## 2017-11-30 DIAGNOSIS — M9903 Segmental and somatic dysfunction of lumbar region: Secondary | ICD-10-CM | POA: Diagnosis not present

## 2017-11-30 DIAGNOSIS — M530 Cervicocranial syndrome: Secondary | ICD-10-CM | POA: Diagnosis not present

## 2017-11-30 DIAGNOSIS — M9901 Segmental and somatic dysfunction of cervical region: Secondary | ICD-10-CM | POA: Diagnosis not present

## 2017-12-07 ENCOUNTER — Other Ambulatory Visit: Payer: Self-pay

## 2017-12-07 DIAGNOSIS — E039 Hypothyroidism, unspecified: Secondary | ICD-10-CM

## 2017-12-22 ENCOUNTER — Other Ambulatory Visit: Payer: BLUE CROSS/BLUE SHIELD | Admitting: Internal Medicine

## 2017-12-23 DIAGNOSIS — M530 Cervicocranial syndrome: Secondary | ICD-10-CM | POA: Diagnosis not present

## 2017-12-23 DIAGNOSIS — M9903 Segmental and somatic dysfunction of lumbar region: Secondary | ICD-10-CM | POA: Diagnosis not present

## 2017-12-23 DIAGNOSIS — M9902 Segmental and somatic dysfunction of thoracic region: Secondary | ICD-10-CM | POA: Diagnosis not present

## 2017-12-23 DIAGNOSIS — M9901 Segmental and somatic dysfunction of cervical region: Secondary | ICD-10-CM | POA: Diagnosis not present

## 2018-05-22 DIAGNOSIS — Z23 Encounter for immunization: Secondary | ICD-10-CM | POA: Diagnosis not present

## 2018-06-15 DIAGNOSIS — Z01419 Encounter for gynecological examination (general) (routine) without abnormal findings: Secondary | ICD-10-CM | POA: Diagnosis not present

## 2018-06-15 DIAGNOSIS — Z1231 Encounter for screening mammogram for malignant neoplasm of breast: Secondary | ICD-10-CM | POA: Diagnosis not present

## 2018-06-15 DIAGNOSIS — Z6826 Body mass index (BMI) 26.0-26.9, adult: Secondary | ICD-10-CM | POA: Diagnosis not present

## 2018-06-19 ENCOUNTER — Other Ambulatory Visit: Payer: Self-pay | Admitting: Obstetrics and Gynecology

## 2018-06-19 DIAGNOSIS — R928 Other abnormal and inconclusive findings on diagnostic imaging of breast: Secondary | ICD-10-CM

## 2018-06-23 ENCOUNTER — Ambulatory Visit
Admission: RE | Admit: 2018-06-23 | Discharge: 2018-06-23 | Disposition: A | Discharge status: Home / Self Care | Payer: BLUE CROSS/BLUE SHIELD | Source: Ambulatory Visit | Attending: Obstetrics and Gynecology | Admitting: Obstetrics and Gynecology

## 2018-06-23 ENCOUNTER — Other Ambulatory Visit: Payer: Self-pay | Admitting: Obstetrics and Gynecology

## 2018-06-23 ENCOUNTER — Ambulatory Visit: Payer: Self-pay

## 2018-06-23 DIAGNOSIS — R921 Mammographic calcification found on diagnostic imaging of breast: Secondary | ICD-10-CM | POA: Diagnosis not present

## 2018-06-23 DIAGNOSIS — R928 Other abnormal and inconclusive findings on diagnostic imaging of breast: Secondary | ICD-10-CM

## 2018-06-23 DIAGNOSIS — N6489 Other specified disorders of breast: Secondary | ICD-10-CM

## 2018-12-20 DIAGNOSIS — Z03818 Encounter for observation for suspected exposure to other biological agents ruled out: Secondary | ICD-10-CM | POA: Diagnosis not present

## 2018-12-27 ENCOUNTER — Other Ambulatory Visit: Payer: BLUE CROSS/BLUE SHIELD

## 2018-12-29 ENCOUNTER — Ambulatory Visit
Admission: RE | Admit: 2018-12-29 | Discharge: 2018-12-29 | Disposition: A | Discharge status: Home / Self Care | Payer: BLUE CROSS/BLUE SHIELD | Source: Ambulatory Visit | Attending: Obstetrics and Gynecology | Admitting: Obstetrics and Gynecology

## 2018-12-29 ENCOUNTER — Other Ambulatory Visit: Payer: Self-pay

## 2018-12-29 ENCOUNTER — Other Ambulatory Visit: Payer: Self-pay | Admitting: Obstetrics and Gynecology

## 2018-12-29 DIAGNOSIS — N6489 Other specified disorders of breast: Secondary | ICD-10-CM | POA: Diagnosis not present

## 2019-05-18 ENCOUNTER — Other Ambulatory Visit: Payer: Self-pay

## 2019-05-18 ENCOUNTER — Ambulatory Visit (INDEPENDENT_AMBULATORY_CARE_PROVIDER_SITE_OTHER): Payer: Managed Care, Other (non HMO) | Admitting: Obstetrics and Gynecology

## 2019-05-18 ENCOUNTER — Encounter: Payer: Self-pay | Admitting: Obstetrics and Gynecology

## 2019-05-18 ENCOUNTER — Other Ambulatory Visit (HOSPITAL_COMMUNITY)
Admission: RE | Admit: 2019-05-18 | Discharge: 2019-05-18 | Disposition: A | Discharge status: Home / Self Care | Payer: Managed Care, Other (non HMO) | Source: Ambulatory Visit | Attending: Obstetrics and Gynecology | Admitting: Obstetrics and Gynecology

## 2019-05-18 VITALS — BP 110/70 | HR 80 | Ht 64.0 in | Wt 173.3 lb

## 2019-05-18 DIAGNOSIS — Z01419 Encounter for gynecological examination (general) (routine) without abnormal findings: Secondary | ICD-10-CM | POA: Diagnosis not present

## 2019-05-18 DIAGNOSIS — Z124 Encounter for screening for malignant neoplasm of cervix: Secondary | ICD-10-CM | POA: Insufficient documentation

## 2019-05-18 NOTE — Progress Notes (Signed)
Patient comes in today for annual exam. She is requesting Pap today. She said she would pay if insurance would not cover. Due for labs today.

## 2019-05-18 NOTE — Progress Notes (Signed)
HPI:      [name] is a 38 y.o. [id number] who LMP was Patient's last menstrual period was 05/07/2019.  Subjective:   She presents today for her annual examination.  She has no complaints today.  She has a tubal ligation for birth control.  She reports normal regular menses monthly. History includes a history of LEEP but a number of normal Pap smears since then. Patient has had ovarian cystectomies performed but no recent issues. She has had a mammogram where they found a "spot" and has had some follow-ups but it seems to be stable and no further intervention is warranted according to her history.    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 She does not have any pertinent problems on file. She  has a past surgical history that includes LEEP; laporoscopy; Ovarian cyst removal; Wisdom tooth extraction; and Laparoscopic tubal ligation (Bilateral, 09/13/2016). Her family history includes Cancer in her paternal grandmother; Diabetes in her maternal grandmother, mother, and paternal grandmother; Heart disease in her paternal aunt; Heart murmur in her brother; Kidney failure in her maternal grandmother; Stroke in her paternal grandfather. She  reports that she quit smoking about 6 years ago. Her smoking use included cigarettes. She has never used smokeless tobacco. She reports that she does not drink alcohol or use drugs. She has a current medication list which includes the following prescription(s): alprazolam. She is allergic to hydrocodone.       Review of Systems:  Review of Systems  Constitutional: Denied constitutional symptoms, night sweats, recent illness, fatigue, fever, insomnia and weight loss.  Eyes: Denied eye  symptoms, eye pain, photophobia, vision change and visual disturbance.  Ears/Nose/Throat/Neck: Denied ear, nose, throat or neck symptoms, hearing loss, nasal discharge, sinus congestion and sore throat.  Cardiovascular: Denied cardiovascular symptoms, arrhythmia, chest pain/pressure, edema, exercise intolerance, orthopnea and palpitations.  Respiratory: Denied pulmonary symptoms, asthma, pleuritic pain, productive sputum, cough, dyspnea and wheezing.  Gastrointestinal: Denied, gastro-esophageal reflux, melena, nausea and vomiting.  Genitourinary: Denied genitourinary symptoms including symptomatic vaginal discharge, pelvic relaxation issues, and urinary complaints.  Musculoskeletal: Denied musculoskeletal symptoms, stiffness, swelling, muscle weakness and myalgia.  Dermatologic: Denied dermatology symptoms, rash and scar.  Neurologic: Denied neurology symptoms, dizziness, headache, neck pain and syncope.  Psychiatric: Denied psychiatric symptoms, anxiety and depression.  Endocrine: Denied endocrine symptoms including hot flashes and night sweats.   Meds:   Current Outpatient Medications on File Prior to Visit  Medication Sig Dispense Refill  . ALPRAZolam (XANAX) 0.25 MG tablet Take 1 tablet (0.25 mg total) 2 (two) times daily as needed by mouth for anxiety. 60 tablet 0   No current facility-administered medications on file prior to visit.     Objective:     Vitals:   05/18/19 0834  BP: 110/70  Pulse: 80              Physical examination General NAD, Conversant  HEENT Atraumatic; Op clear with mmm.  Normo-cephalic. Pupils reactive. Anicteric sclerae  Thyroid/Neck Smooth without nodularity or enlargement. Normal ROM.  Neck Supple.  Skin No rashes, lesions or ulceration. Normal palpated skin turgor. No nodularity.  Breasts: No masses or discharge.  Symmetric.  No axillary adenopathy.  Lungs: Clear to auscultation.No  rales or wheezes. Normal Respiratory effort, no retractions.   Heart: NSR.  No murmurs or rubs appreciated. No periferal edema  Abdomen: Soft.  Non-tender.  No masses.  No HSM. No hernia  Extremities: Moves all appropriately.  Normal ROM for age. No lymphadenopathy.  Neuro: Oriented to PPT.  Normal mood. Normal affect.     Pelvic:   Vulva: Normal appearance.  No lesions.  Vagina: No lesions or abnormalities noted.  Support: Normal pelvic support.  Urethra No masses tenderness or scarring.  Meatus Normal size without lesions or prolapse.  Cervix: Normal appearance.  No lesions.  Anus: Normal exam.  No lesions.  Perineum: Normal exam.  No lesions.        Bimanual   Uterus: Normal size.  Non-tender.  Mobile.  AV.  Adnexae: No masses.  Non-tender to palpation.  Cul-de-sac: Negative for abnormality.      Assessment:    [id number] Patient Active Problem List   Diagnosis Date Noted  . Palpitations 10/25/2016  . Syncope   . Anxiety 10/25/2011     1. Well woman exam with routine gynecological exam   2. Screening for cervical cancer        Plan:            1.  Basic Screening Recommendations The basic screening recommendations for asymptomatic women were discussed with the patient during her visit.  The age-appropriate recommendations were discussed with her and the rational for the tests reviewed.  When I am informed by the patient that another primary care physician has previously obtained the age-appropriate tests and they are up-to-date, only outstanding tests are ordered and referrals given as necessary.  Abnormal results of tests will be discussed with her when all of her results are completed. Lab work ordered-Pap performed today. Orders Orders Placed This Encounter  Procedures  . Hemoglobin A1c  . Lipid panel  . TSH    No orders of the defined types were placed in this encounter.       F/U  Return in about 1 year (around 05/17/2020) for Annual Physical.  Finis Bud, M.D. 05/18/2019 9:00 AM

## 2019-05-19 LAB — HEMOGLOBIN A1C
Est. average glucose Bld gHb Est-mCnc: 103 mg/dL
Hgb A1c MFr Bld: 5.2 % (ref 4.8–5.6)

## 2019-05-19 LAB — LIPID PANEL
Chol/HDL Ratio: 3.2 ratio (ref 0.0–4.4)
Cholesterol, Total: 201 mg/dL — ABNORMAL HIGH (ref 100–199)
HDL: 62 mg/dL (ref >39)
LDL Chol Calc (NIH): 119 mg/dL — ABNORMAL HIGH (ref 0–99)
Triglycerides: 110 mg/dL (ref 0–149)
VLDL Cholesterol Cal: 20 mg/dL (ref 5–40)

## 2019-05-19 LAB — TSH: TSH: 0.82 u[IU]/mL (ref 0.450–4.500)

## 2019-05-29 LAB — CYTOLOGY - PAP
Comment: NEGATIVE
Diagnosis: NEGATIVE
High risk HPV: NEGATIVE

## 2019-06-06 ENCOUNTER — Encounter: Payer: Self-pay | Admitting: Internal Medicine

## 2019-06-06 ENCOUNTER — Telehealth: Payer: Self-pay | Admitting: Internal Medicine

## 2019-06-06 NOTE — Telephone Encounter (Signed)
LVM to CB and schedule CPE and Labs past due 06/27/18

## 2019-06-06 NOTE — Telephone Encounter (Signed)
Mailed letter that CPE is past due, to call office and schedule CPE and Labs.  Last one 06/27/17

## 2019-07-09 ENCOUNTER — Other Ambulatory Visit: Payer: Self-pay

## 2019-07-09 ENCOUNTER — Ambulatory Visit
Admission: RE | Admit: 2019-07-09 | Discharge: 2019-07-09 | Disposition: A | Discharge status: Home / Self Care | Payer: Managed Care, Other (non HMO) | Source: Ambulatory Visit | Attending: Obstetrics and Gynecology | Admitting: Obstetrics and Gynecology

## 2019-07-09 DIAGNOSIS — N6489 Other specified disorders of breast: Secondary | ICD-10-CM

## 2019-10-08 ENCOUNTER — Telehealth: Payer: Self-pay | Admitting: Internal Medicine

## 2019-10-08 NOTE — Telephone Encounter (Signed)
Traci Rice [phone]  [name] called to say she started having right calf pain back in December, it is some better now, but she thinks she might need to be seen to make sure she does not have blood clot.

## 2019-10-08 NOTE — Telephone Encounter (Signed)
This is [facility]' daughter. She has not been seen here since 2018. Can come this week when we have an opening.

## 2019-10-09 NOTE — Telephone Encounter (Signed)
Appointment scheduled.

## 2019-10-11 ENCOUNTER — Ambulatory Visit: Payer: Managed Care, Other (non HMO) | Attending: Internal Medicine

## 2019-10-11 DIAGNOSIS — Z23 Encounter for immunization: Secondary | ICD-10-CM | POA: Insufficient documentation

## 2019-10-11 NOTE — Progress Notes (Signed)
   Covid-19 Vaccination Clinic  Name:  Traci Rice    MRN: [id number] DOB: 04-Dec-1980  10/11/2019  [name] was observed post Covid-19 immunization for 15 minutes without incident. She was provided with Vaccine Information Sheet and instruction to access the V-Safe system.   [name] was instructed to call 911 with any severe reactions post vaccine: Marland Kitchen Difficulty breathing  . Swelling of face and throat  . A fast heartbeat  . A bad rash all over body  . Dizziness and weakness   Immunizations Administered    Name Date Dose VIS Date Route   Moderna COVID-19 Vaccine 10/11/2019  3:31 PM 0.5 mL 07/10/2019 Intramuscular   Manufacturer: Moderna   Lot: [id number]   NDC: 40086-761-95

## 2019-10-12 ENCOUNTER — Ambulatory Visit (INDEPENDENT_AMBULATORY_CARE_PROVIDER_SITE_OTHER): Payer: Managed Care, Other (non HMO) | Admitting: Internal Medicine

## 2019-10-12 ENCOUNTER — Encounter: Payer: Self-pay | Admitting: Internal Medicine

## 2019-10-12 ENCOUNTER — Ambulatory Visit (HOSPITAL_COMMUNITY)
Admission: RE | Admit: 2019-10-12 | Discharge: 2019-10-12 | Disposition: A | Discharge status: Home / Self Care | Payer: Managed Care, Other (non HMO) | Source: Ambulatory Visit | Attending: Cardiovascular Disease | Admitting: Cardiovascular Disease

## 2019-10-12 ENCOUNTER — Other Ambulatory Visit: Payer: Self-pay

## 2019-10-12 VITALS — BP 108/80 | HR 86 | Temp 98.0°F | Ht 64.0 in | Wt 169.0 lb

## 2019-10-12 DIAGNOSIS — M79661 Pain in right lower leg: Secondary | ICD-10-CM | POA: Insufficient documentation

## 2019-10-12 MED ORDER — MELOXICAM 15 MG PO TABS: 15.0000 mg | ORAL_TABLET | Freq: Every day | ORAL | 0 refills | Status: DC

## 2019-10-12 NOTE — Progress Notes (Addendum)
   Subjective:    Patient ID: Traci Rice, female    DOB: 03/18/1981, 39 y.o.   MRN: [id number]  HPI 39 year old Female not seen here since November 2018 in for complaint of right calf pain for a number of weeks. Thought initially it might be due to wearing heels. Otherwise, no known injury.   Mother has been diagnosed with metastatic lung cancer and also had left peroneal DVT recently and that has worried patient;however,  explained to patient  That cancer can exacerbate clotting.  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  Review of Systems no known leg injury     Objective:   Physical Exam BP 108/80 pulse 86 afebrile, Weight 169 pounds BMI 29.01 Right calf is soft nontender without erythema.      Assessment & Plan:  Musculoskeletal pain right calf  Plan: Patient would like DVT to be ruled out.  Have scheduled Doppler studies of the right lower extremity at cardiology office.  Have prescribed Mobic 15 mg daily.  Call if symptoms do not improve in the next 2 weeks.  Addendum: Doppler studies were actually done today and are negative for DVT.

## 2019-10-12 NOTE — Patient Instructions (Signed)
Have Doppler studies to rule out DVT.  Mobic 15 mg daily #30 with no refill.  Call if not improving in 2 weeks.

## 2019-11-09 ENCOUNTER — Other Ambulatory Visit: Payer: Self-pay | Admitting: Internal Medicine

## 2019-11-12 NOTE — Telephone Encounter (Signed)
Please call her. She should be better by now and not need refill.

## 2019-11-13 ENCOUNTER — Ambulatory Visit: Payer: Managed Care, Other (non HMO) | Attending: Family

## 2019-11-13 DIAGNOSIS — Z23 Encounter for immunization: Secondary | ICD-10-CM

## 2019-11-13 NOTE — Telephone Encounter (Signed)
Left detailed message.   

## 2019-11-13 NOTE — Progress Notes (Signed)
   Covid-19 Vaccination Clinic  Name:  Shaaron Golliday    MRN: [id number] DOB: Aug 29, 1980  11/13/2019  Ms. Lein was observed post Covid-19 immunization for 15 minutes without incident. She was provided with Vaccine Information Sheet and instruction to access the V-Safe system.   Ms. Delia was instructed to call 911 with any severe reactions post vaccine: Marland Kitchen Difficulty breathing  . Swelling of face and throat  . A fast heartbeat  . A bad rash all over body  . Dizziness and weakness   Immunizations Administered    Name Date Dose VIS Date Route   Moderna COVID-19 Vaccine 11/13/2019  3:35 PM 0.5 mL 07/10/2019 Intramuscular   Manufacturer: Moderna   Lot: [id number]   NDC: 00459-977-41

## 2020-07-29 ENCOUNTER — Ambulatory Visit
Admission: EM | Admit: 2020-07-29 | Discharge: 2020-07-29 | Disposition: A | Discharge status: Home / Self Care | Payer: Managed Care, Other (non HMO) | Attending: Family Medicine | Admitting: Family Medicine

## 2020-07-29 ENCOUNTER — Encounter: Payer: Self-pay | Admitting: Emergency Medicine

## 2020-07-29 ENCOUNTER — Other Ambulatory Visit: Payer: Self-pay

## 2020-07-29 ENCOUNTER — Telehealth: Payer: Self-pay | Admitting: Internal Medicine

## 2020-07-29 DIAGNOSIS — T7840XA Allergy, unspecified, initial encounter: Secondary | ICD-10-CM | POA: Diagnosis not present

## 2020-07-29 MED ORDER — METHYLPREDNISOLONE SODIUM SUCC 125 MG IJ SOLR
80.0000 mg | Freq: Once | INTRAMUSCULAR | Status: AC
  Administered 2020-07-29: 80 mg via INTRAMUSCULAR

## 2020-07-29 NOTE — Telephone Encounter (Signed)
Traci Rice [phone]  [name] called to say she has been itching and has whelps all over her body even in her private area and on her butt since Saturday. She has tried nothing new.

## 2020-07-29 NOTE — ED Provider Notes (Signed)
Traci Rice    CSN: [id number] Arrival date & time: 07/29/20  1308      History   Chief Complaint Chief Complaint  Patient presents with  . Rash    HPI Traci Rice is a 39 y.o. female.   Patient is a 39 year old female presents today with rash.  This started Saturday.  Generalized body hives.  Significant itching.  She is also had some mild acid reflux is eating and drinking.  This is happened to her from time to time.  No shortness of breath, trouble swallowing or breathing.  Took over-the-counter allergy medication to include Allegra and Benadryl with some relief.  The only change is went to a dinner party Friday night prior to this starting.  Unsure of any food allergies.     Past Medical History:  Diagnosis Date  . Abnormal Pap smear   . Attention deficit disorder (ADD) 2013  . 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 GERD (gastroesophageal reflux disease)   . Overweight (BMI 25.0-29.9)   . Syncope    teenager    Patient Active Problem List   Diagnosis Date Noted  . Palpitations 10/25/2016  . Syncope   . Anxiety 10/25/2011    Past Surgical History:  Procedure Laterality Date  . LAPAROSCOPIC TUBAL LIGATION Bilateral 09/13/2016   Procedure: LAPAROSCOPIC TUBAL LIGATION;  Surgeon: Tereso Newcomer, MD;  Location: [facility];  Service: Gynecology;  Laterality: Bilateral;  . laporoscopy     remove cyst from left ovary  . LEEP    . OVARIAN CYST REMOVAL    . WISDOM TOOTH EXTRACTION      OB History    Gravida  2   Para  2   Term  1   Preterm  1   AB      Living  2     SAB      IAB      Ectopic      Multiple  0   Live Births  2            Home Medications    Prior to Admission medications   Medication Sig Start Date End Date Taking? Authorizing Provider  ASPIRIN 81 PO Take by mouth.    [provider]  Biotin 1 MG CAPS Take by mouth.    [provider]  Calcium-Magnesium 100-50 MG TABS Take by mouth.     [provider]  ferrous sulfate 325 (65 FE) MG EC tablet Take 325 mg by mouth 3 (three) times daily with meals.    [provider]  meloxicam (MOBIC) 15 MG tablet Take 1 tablet (15 mg total) by mouth daily. 10/12/19   Margaree Mackintosh, MD  Multiple Vitamin (MULTIVITAMIN) capsule Take 1 capsule by mouth daily.    [provider]  vitamin B-12 (CYANOCOBALAMIN) 500 MCG tablet Take 500 mcg by mouth daily.    [provider]  zinc gluconate 50 MG tablet Take 50 mg by mouth daily.    [provider]    Family History Family History  Problem Relation Age of Onset  . Diabetes Mother   . Stroke Paternal Grandfather   . Heart disease Paternal Aunt   . Diabetes Maternal Grandmother   . Kidney failure Maternal Grandmother   . Diabetes Paternal Grandmother   . Cancer Paternal Grandmother   . Heart murmur Brother     Social History Social History   Tobacco Use  . Smoking status: Former Smoker  Types: Cigarettes    Quit date: 08/01/2012    Years since quitting: 7.9  . Smokeless tobacco: Never Used  Substance Use Topics  . Alcohol use: No  . Drug use: No     Allergies   Hydrocodone   Review of Systems Review of Systems   Physical Exam Triage Vital Signs ED Triage Vitals  Enc Vitals Group     BP 07/29/20 1320 115/77     Pulse Rate 07/29/20 1320 (!) 102     Resp 07/29/20 1320 17     Temp 07/29/20 1320 99 F (37.2 C)     Temp Source 07/29/20 1320 Oral     SpO2 07/29/20 1320 96 %     Weight --      Height --      Head Circumference --      Peak Flow --      Pain Score 07/29/20 1318 0     Pain Loc --      Pain Edu? --      Excl. in GC? --    No data found.  Updated Vital Signs BP 115/77 (BP Location: Left Arm)   Pulse (!) 102   Temp 99 F (37.2 C) (Oral)   Resp 17   LMP 07/13/2020   SpO2 96%   Visual Acuity Right Eye Distance:   Left Eye Distance:   Bilateral Distance:    Right Eye Near:   Left Eye Near:     Bilateral Near:     Physical Exam Vitals and nursing note reviewed.  Constitutional:      General: She is not in acute distress.    Appearance: Normal appearance. She is not ill-appearing, toxic-appearing or diaphoretic.  HENT:     Head: Normocephalic.     Nose: Nose normal.  Eyes:     Conjunctiva/sclera: Conjunctivae normal.  Pulmonary:     Effort: Pulmonary effort is normal.  Musculoskeletal:        General: Normal range of motion.     Cervical back: Normal range of motion.  Skin:    General: Skin is warm and dry.     Findings: Rash present.  Neurological:     Mental Status: She is alert.  Psychiatric:        Mood and Affect: Mood normal.      UC Treatments / Results  Labs (all labs ordered are listed, but only abnormal results are displayed) Labs Reviewed - No data to display  EKG   Radiology No results found.  Procedures Procedures (including critical care time)  Medications Ordered in UC Medications  methylPREDNISolone sodium succinate (SOLU-MEDROL) 125 mg/2 mL injection 80 mg (80 mg Intramuscular Given 07/29/20 1339)    Initial Impression / Assessment and Plan / UC Course  I have reviewed the triage vital signs and the nursing notes.  Pertinent labs & imaging results that were available during my care of the patient were reviewed by me and considered in my medical decision making (see chart for details).      Allergic reaction with generalized urticaria and dermatographia which she has had in the past Steroid shot given here for symptoms.  Continue the Allegra and Benadryl. Spoke with patient about possible gluten sensitivity based on the rash, acid reflux symptoms Follow up as needed for continued or worsening symptoms  Final Clinical Impressions(s) / UC Diagnoses   Final diagnoses:  Allergic reaction, initial encounter     Discharge Instructions     Steroid injection  given here.  You can continue the Allegra and Benadryl. Follow up as  needed for continued or worsening symptoms     ED Prescriptions    None     PDMP not reviewed this encounter.   Janace Aris, NP 07/29/20 1344

## 2020-07-29 NOTE — ED Triage Notes (Signed)
Patient c/o generalized rash since Saturday.   Patient endorses acid reflux. Patient states eating and drinking makes acid reflux worst.   Patient states rash will flare up at times.   Patient denies SOB.   Patient endorses itching.   Patient took OTC allergy medication w/ some relief of symptoms.

## 2020-07-29 NOTE — Discharge Instructions (Addendum)
Steroid injection given here.  You can continue the Allegra and Benadryl. Follow up as needed for continued or worsening symptoms

## 2020-07-29 NOTE — Telephone Encounter (Signed)
It looks like she went to Urgent Care. Can you confirm with her?

## 2020-07-29 NOTE — Telephone Encounter (Signed)
LVM for patient to CB.

## 2020-07-31 ENCOUNTER — Ambulatory Visit (INDEPENDENT_AMBULATORY_CARE_PROVIDER_SITE_OTHER): Payer: Managed Care, Other (non HMO) | Admitting: Internal Medicine

## 2020-07-31 ENCOUNTER — Encounter: Payer: Self-pay | Admitting: Internal Medicine

## 2020-07-31 ENCOUNTER — Other Ambulatory Visit: Payer: Self-pay

## 2020-07-31 VITALS — BP 120/70 | HR 88 | Temp 98.6°F | Ht 64.0 in | Wt 173.0 lb

## 2020-07-31 DIAGNOSIS — L509 Urticaria, unspecified: Secondary | ICD-10-CM | POA: Diagnosis not present

## 2020-07-31 DIAGNOSIS — F439 Reaction to severe stress, unspecified: Secondary | ICD-10-CM | POA: Diagnosis not present

## 2020-07-31 MED ORDER — PREDNISONE 10 MG PO TABS: ORAL_TABLET | ORAL | 0 refills | Status: DC

## 2020-07-31 NOTE — Progress Notes (Signed)
   Subjective:    Patient ID: Traci Rice, female    DOB: 10-10-1980, 39 y.o.   MRN: [id number]  HPI   39 year old Female seen at urgent care yesterday in [place] with hives. Treated with Depomedrol and was told to take Allegra and Benadryl.  Not taking NSAIDs that would have triggered urticaria.  No recent viral illness, cough or sore throat.  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  Review of Systems see above-situational stress with Mother who has lung cancer.    Has had 2 Moderna vaccines in March and April of this year.     Objective:   Physical Exam Blood pressure 120/70 pulse 88 temperature 98.6 degrees pulse oximetry 98% Weight 173 pounds Still with patchy erythema and urticarial lesions scattered on body.  Denies shortness of breath or wheezing.     Assessment & Plan:  Urticaria see above.  Be treated with prednisone, Zyrtec and Pepcid.  Situational stress with mother's illness and going to school while working full-time job  Plan: Patient will take prednisone in tapering course as directed over 12 days starting with 6 tablets day 1 and decreasing by 1 tab every 2 days.  Currently not taking meloxicam which can also cause hives.  Call if not improving.  Take Zyrtec at night.  May take Pepcid 20 mg twice a day.  Zyrtec and Pepcid also help and to carry in addition to prednisone.

## 2020-07-31 NOTE — Patient Instructions (Addendum)
Take Zyrtec at night. Take prednisone in tapering course as directed for 12 days. Take Pepcid 20 mg twice a day.

## 2020-07-31 NOTE — Telephone Encounter (Signed)
OV

## 2020-07-31 NOTE — Telephone Encounter (Signed)
Patient called she said she got a steroid shot at urgent care on 12/21 and she still has hives/rash popping up she still itching and she feels like the steroid shot did not help she would like to come in and be seen.  Call back number [phone]

## 2020-07-31 NOTE — Telephone Encounter (Signed)
scheduled

## 2020-08-21 ENCOUNTER — Other Ambulatory Visit: Payer: Managed Care, Other (non HMO) | Admitting: Internal Medicine

## 2020-08-21 ENCOUNTER — Other Ambulatory Visit: Payer: Self-pay

## 2020-08-21 DIAGNOSIS — E785 Hyperlipidemia, unspecified: Secondary | ICD-10-CM

## 2020-08-21 DIAGNOSIS — Z0000 Encounter for general adult medical examination without abnormal findings: Secondary | ICD-10-CM

## 2020-08-21 DIAGNOSIS — F419 Anxiety disorder, unspecified: Secondary | ICD-10-CM

## 2020-08-21 DIAGNOSIS — Z1321 Encounter for screening for nutritional disorder: Secondary | ICD-10-CM

## 2020-08-21 DIAGNOSIS — Z13220 Encounter for screening for lipoid disorders: Secondary | ICD-10-CM

## 2020-08-21 DIAGNOSIS — R7989 Other specified abnormal findings of blood chemistry: Secondary | ICD-10-CM

## 2020-08-21 DIAGNOSIS — E7800 Pure hypercholesterolemia, unspecified: Secondary | ICD-10-CM

## 2020-08-22 LAB — CBC WITH DIFFERENTIAL/PLATELET
Absolute Monocytes: 693 cells/uL (ref 200–950)
Basophils Absolute: 21 cells/uL (ref 0–200)
Basophils Relative: 0.3 %
Eosinophils Absolute: 98 cells/uL (ref 15–500)
Eosinophils Relative: 1.4 %
HCT: 42.7 % (ref 35.0–45.0)
Hemoglobin: 14.2 g/dL (ref 11.7–15.5)
Lymphs Abs: 1547 cells/uL (ref 850–3900)
MCH: 31.3 pg (ref 27.0–33.0)
MCHC: 33.3 g/dL (ref 32.0–36.0)
MCV: 94.3 fL (ref 80.0–100.0)
MPV: 11.2 fL (ref 7.5–12.5)
Monocytes Relative: 9.9 %
Neutro Abs: 4641 cells/uL (ref 1500–7800)
Neutrophils Relative %: 66.3 %
Platelets: 245 10*3/uL (ref 140–400)
RBC: 4.53 10*6/uL (ref 3.80–5.10)
RDW: 12.2 % (ref 11.0–15.0)
Total Lymphocyte: 22.1 %
WBC: 7 10*3/uL (ref 3.8–10.8)

## 2020-08-22 LAB — COMPLETE METABOLIC PANEL WITH GFR
AG Ratio: 1.8 (calc) (ref 1.0–2.5)
ALT: 21 U/L (ref 6–29)
AST: 17 U/L (ref 10–30)
Albumin: 4.3 g/dL (ref 3.6–5.1)
Alkaline phosphatase (APISO): 54 U/L (ref 31–125)
BUN: 10 mg/dL (ref 7–25)
CO2: 27 mmol/L (ref 20–32)
Calcium: 9.5 mg/dL (ref 8.6–10.2)
Chloride: 106 mmol/L (ref 98–110)
Creat: 0.56 mg/dL (ref 0.50–1.10)
GFR, Est African American: 137 mL/min/{1.73_m2} (ref >=60)
GFR, Est Non African American: 118 mL/min/{1.73_m2} (ref >=60)
Globulin: 2.4 g/dL (calc) (ref 1.9–3.7)
Glucose, Bld: 82 mg/dL (ref 65–99)
Potassium: 4.4 mmol/L (ref 3.5–5.3)
Sodium: 141 mmol/L (ref 135–146)
Total Bilirubin: 0.4 mg/dL (ref 0.2–1.2)
Total Protein: 6.7 g/dL (ref 6.1–8.1)

## 2020-08-22 LAB — LIPID PANEL
Cholesterol: 191 mg/dL (ref <200)
HDL: 57 mg/dL (ref >=50)
LDL Cholesterol (Calc): 115 mg/dL (calc) — ABNORMAL HIGH
Non-HDL Cholesterol (Calc): 134 mg/dL (calc) — ABNORMAL HIGH (ref <130)
Total CHOL/HDL Ratio: 3.4 (calc) (ref <5.0)
Triglycerides: 86 mg/dL (ref <150)

## 2020-08-22 LAB — TSH: TSH: 0.51 mIU/L

## 2020-08-29 ENCOUNTER — Ambulatory Visit (INDEPENDENT_AMBULATORY_CARE_PROVIDER_SITE_OTHER): Payer: Managed Care, Other (non HMO) | Admitting: Internal Medicine

## 2020-08-29 ENCOUNTER — Encounter: Payer: Self-pay | Admitting: Internal Medicine

## 2020-08-29 ENCOUNTER — Other Ambulatory Visit: Payer: Self-pay

## 2020-08-29 VITALS — BP 102/70 | HR 84 | Ht 64.0 in | Wt 172.0 lb

## 2020-08-29 DIAGNOSIS — Z0000 Encounter for general adult medical examination without abnormal findings: Secondary | ICD-10-CM

## 2020-08-29 DIAGNOSIS — L509 Urticaria, unspecified: Secondary | ICD-10-CM

## 2020-08-29 LAB — POCT URINALYSIS DIPSTICK
Appearance: NEGATIVE
Bilirubin, UA: NEGATIVE
Blood, UA: NEGATIVE
Glucose, UA: NEGATIVE
Ketones, UA: NEGATIVE
Leukocytes, UA: NEGATIVE
Nitrite, UA: NEGATIVE
Odor: NEGATIVE
Protein, UA: NEGATIVE
Spec Grav, UA: 1.015 (ref 1.010–1.025)
Urobilinogen, UA: 0.2 E.U./dL
pH, UA: 7 (ref 5.0–8.0)

## 2020-08-29 NOTE — Progress Notes (Signed)
   Subjective:    Patient ID: Traci Rice, female    DOB: 1980-12-26, 40 y.o.   MRN: [id number]  HPI 40 year old Female for health maintenance exam and evaluation of medical issues.  Was seen in 2018 with palpitations and was referred to cardiology.  EKG was unremarkable.  It was felt that she likely had PACs or PVCs.  2D echocardiogram and Holter monitor were ordered but were not done.  This seemed to be associated with breast-feeding.  Patient denied being stressed.  Symptoms resolved.  TSH magnesium and basic metabolic panel were normal.  Has had recent issues with urticaria seen on December 23 here.  Had been treated in [place] with hives with Depo-Medrol.  No recent viral illness cough or sore throat.  Was treated here with a tapering course of prednisone over 12 days.  Was advised to take Zyrtec at night as a histamine blocker.  Was not taking meloxicam which can also cause hives.  Was advised to take a histamine blocker such as Pepcid 20 mg twice daily.  Seems to have improved.  Social history: She is married.  Has 2 children.  Employed full-time in [employer] position  at [employer] and is studying for [employer].  Mother has been diagnosed with lung cancer.  Parents operate [employer].  GYN is [facility]. Felt like had yeast infection recently just before onset of menses.  Takes Vitamin D over-the-counter.  Has had 3 Moderna vaccines.  No known drug allergies  Had LEEP procedure some 10 years ago.  Had tubal ligation after birth of second child.  Initially presented here in 2000 for tonsillitis.  Had vasovagal syncope in 2001.  History of smoking in 2003 but no longer smokes.  Review of Systems see above  Family history: Maternal grandmother  with history of diabetes.  Mother with metastatic lung cancer.     Objective:   Physical Exam Skin warm and dry.  No cervical adenopathy.  TMs are clear.  Neck is supple.  Chest  is clear to auscultation.  Breast without masses.  Cardiac exam regular rate and rhythm.  Abdomen soft nondistended without hepatosplenomegaly masses or tenderness.  GYN exam is deferred to GYN physician.  No lower extremity pitting edema or deformity.  Neuro is intact without focal deficits.  Affect thought and judgment are normal.       Assessment & Plan:  Recent urticaria-could be stress related.  May want to see allergist regarding treatment and cost.  Currently treated with Zyrtec.  History of anxiety-used to take Zoloft but no longer does so.  Mild elevation of LDL at 115-suggest diet and exercise.  Plan: Return in 1 to 2 years or as needed.

## 2020-09-03 NOTE — Patient Instructions (Signed)
It was a pleasure to see you today.  We will make referral to allergist regarding urticaria.

## 2020-09-17 ENCOUNTER — Telehealth: Payer: Self-pay | Admitting: Internal Medicine

## 2020-09-17 NOTE — Telephone Encounter (Signed)
Got patient in for 09/29/2020

## 2020-09-17 NOTE — Telephone Encounter (Signed)
I think we should hold off prescribing additional meds until she is seen by Allergist.

## 2020-09-17 NOTE — Telephone Encounter (Signed)
Please call Allergist and see if referral can be expedited. She has urticaria.

## 2020-09-17 NOTE — Telephone Encounter (Signed)
Traci Rice [phone]  [name] called to say when she was here that you guys talked about her possible taking a antidepressant for the itching, she was wandering if she could possible try sertraline, since you had given her that in the past. She could not get an appointment with allergy until 10/22/2020.

## 2020-09-17 NOTE — Telephone Encounter (Signed)
Called patient to let her know to hold off on prescribing any other medications for now and she total agrees.

## 2020-09-29 ENCOUNTER — Other Ambulatory Visit: Payer: Self-pay

## 2020-09-29 ENCOUNTER — Encounter: Payer: Self-pay | Admitting: Allergy

## 2020-09-29 ENCOUNTER — Ambulatory Visit (INDEPENDENT_AMBULATORY_CARE_PROVIDER_SITE_OTHER): Payer: Managed Care, Other (non HMO) | Admitting: Allergy

## 2020-09-29 VITALS — BP 124/70 | HR 90 | Temp 98.0°F | Resp 16 | Ht 64.0 in | Wt 170.4 lb

## 2020-09-29 DIAGNOSIS — J310 Chronic rhinitis: Secondary | ICD-10-CM | POA: Diagnosis not present

## 2020-09-29 DIAGNOSIS — L508 Other urticaria: Secondary | ICD-10-CM | POA: Diagnosis not present

## 2020-09-29 NOTE — Progress Notes (Signed)
New Patient Note  RE: Traci Rice MRN: [id number] DOB: November 18, 1980 Date of Office Visit: 09/29/2020  Referring provider: Margaree Mackintosh, MD Primary care provider: Margaree Mackintosh, MD  Chief Complaint: hives  History of present illness: Traci Rice is a 40 y.o. female presenting today for consultation for urticaria.  She started having hives in Dec 2021.  She states she had an episode of hives once in 2013.   She is reporting hives that occur daily but states the zyrtec daily helps.  She also states she took pepcid of the Zyrtec at 1 point which also was helpful.  She has no longer taking Pepcid.  She does feel the daily Zyrtec helps manages her hives well.  The hives are usually worse in the evenings.  Since she has been off her Zyrtec for this visit today she states the hives have been all throughout the day.  Hives are very itchy and last less than 1 hour in general before resolving.  Does not define any marks or bruising.  No swelling.  No joints aches/apins. No fevers.  No preceding illnesses.   She did have flu shot in Nov 2021 and covid booster in December 2021. She was on the litany of supplemental medications including vitamin B12, folic acid, biotin, D3, lysine, women's multivitamin, iron, vitamin vitamin D3, fish oil, flaxseed oil, vitamin C, calcium/magnesium/zinc, multicollagen, neuro magnesium, D IM.  She states she stopped all of the supplements and has been off for the past 2 weeks at least and is unsure if she will resume them.  She has not noted any changes in her hives being off the supplements. She did have a steroid injection as well as a prednisone oral course from her PCP for hives.    She does report season allergies that she started having in mid-20s.  She states the symptoms are very season to season.  She does report sinus headache, sinus pressure, runny nose.   No history of food allergy.  Review of systems: Review of Systems   Constitutional: Negative.   HENT: Negative.   Eyes: Negative.   Respiratory: Negative.   Cardiovascular: Negative.   Gastrointestinal: Negative.   Musculoskeletal: Negative.   Skin: Positive for itching and rash.  Neurological: Negative.     All other systems negative unless noted above in HPI  Past medical history: Past Medical History:  Diagnosis Date  . Abnormal Pap smear   . Attention deficit disorder (ADD) 2013  . 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 GERD (gastroesophageal reflux disease)   . Overweight (BMI 25.0-29.9)   . Syncope    teenager    Past surgical history: Past Surgical History:  Procedure Laterality Date  . LAPAROSCOPIC TUBAL LIGATION Bilateral 09/13/2016   Procedure: LAPAROSCOPIC TUBAL LIGATION;  Surgeon: Tereso Newcomer, MD;  Location: [facility];  Service: Gynecology;  Laterality: Bilateral;  . laporoscopy     remove cyst from left ovary  . LEEP    . OVARIAN CYST REMOVAL    . WISDOM TOOTH EXTRACTION      Family history:  Family History  Problem Relation Age of Onset  . Diabetes Mother   . Stroke Paternal Grandfather   . Heart disease Paternal Aunt   . Diabetes Maternal Grandmother   . Kidney failure Maternal Grandmother   . Diabetes Paternal Grandmother   . Cancer Paternal Grandmother   . Heart murmur Brother     Social history: She lives in a townhome with out carpeting  with gas heating and central cooling.  There is a dog in the home.  There are cats and dogs outside the home.  There is no concern for water damage, mildew or roaches in the home.  She is an Designer, industrial/product. Tobacco Use  . Smoking status: Former Smoker    Types: Cigarettes    Quit date: 08/01/2012    Years since quitting: 8.1  . Smokeless tobacco: Never Used   Medication List: Current Outpatient Medications  Medication Sig Dispense Refill  . cetirizine (ZYRTEC) 10 MG tablet Take 10 mg by mouth daily.     No current facility-administered medications for this visit.     Known medication allergies: Allergies  Allergen Reactions  . Hydrocodone Other (See Comments)    Does not do well with cough syrup and shaky,"Pill form is ok"     Physical examination: Blood pressure 124/70, pulse 90, temperature 98 F (36.7 C), temperature source Tympanic, resp. rate 16, height 5\' 4"  (1.626 m), weight 170 lb 6.4 oz (77.3 kg), SpO2 99 %.  General: Alert, interactive, in no acute distress. HEENT: PERRLA, TMs pearly gray, turbinates non-edematous without discharge, post-pharynx non erythematous. Neck: Supple without lymphadenopathy. Lungs: Clear to auscultation without wheezing, rhonchi or rales. {no increased work of breathing. CV: Normal S1, S2 without murmurs. Abdomen: Nondistended, nontender. Skin: Scattered erythematous urticarial type lesions primarily located Bilateral arms.  Erythematous linear scratch marks on left posterior arm consistent with dermatographia , nonvesicular. Extremities:  No clubbing, cyanosis or edema. Neuro:   Grossly intact.  Diagnositics/Labs:  Allergy testing: Deferred due to ongoing urticaria  Assessment and plan:   Chronic urticaria - at this time etiology of hives is unknown but you do have component of physical hives with pressure induced hives.  We have seen increasing amount of hives following Covid vaccination as the immune system ramps up to produce antibodies the allergy cells also can become activated leading to hives.  We also know things like alcohol, strawberries and NSAIDs (ibuprofen, advil, motrin, aleve, etc) can be allergy cell destabilizers and can flare hives.  Hives can be caused by a variety of different triggers including illness/infection, foods, medications, stings, exercise, pressure, vibrations, extremes of temperature to name a few however majority of the time there is no identifiable trigger.  Your symptoms have been ongoing for >6 weeks making this chronic thus will obtain labwork to evaluate: tryptase, hive  panel, environmental panel, alpha-gal panel - CBC w diff and CMP as well as thyroid study done by your PCP were all unremarkable and reassuring.   - continue Zyrtec 10mg  1 tab daily.   If this is not enough to control hives then increase to Zyrtec and Pepcid 1 tab daily.  If that is not enough control symptoms in increase to Zyrtec and Pepcid 1 tab twice a day.  If that is not enough then let know and would recommend Singulair to your regimen.   If triple therapy regimen is not effective enough then recommend initiating Xolair monthly injections.  We can discuss this further if needed.    Environmental allergy - environmental allergy panel as above - zyrtec as above - for runny nose can use nasal Astelin 2 sprays each nostril 1-2 times a day as needed  Follow-up in 2-3 months or sooner if needed  I appreciate the opportunity to take part in Tasmin's care. Please do not hesitate to contact me with questions.  Sincerely,   , MD Allergy/Immunology [facility]

## 2020-09-29 NOTE — Patient Instructions (Addendum)
Chronic Hives - at this time etiology of hives is unknown but you do have component of physical hives with pressure induced hives.  We have seen increasing amount of hives following Covid vaccination as the immune system ramps up to produce antibodies the allergy cells also can become activated leading to hives.  We also know things like alcohol, strawberries and NSAIDs (ibuprofen, advil, motrin, aleve, etc) can be allergy cell destabilizers and can flare hives.  Hives can be caused by a variety of different triggers including illness/infection, foods, medications, stings, exercise, pressure, vibrations, extremes of temperature to name a few however majority of the time there is no identifiable trigger.  Your symptoms have been ongoing for >6 weeks making this chronic thus will obtain labwork to evaluate: tryptase, hive panel, environmental panel, alpha-gal panel - CBC w diff and CMP as well as thyroid study done by your PCP were all unremarkable and reassuring.   - continue Zyrtec 10mg  1 tab daily.   If this is not enough to control hives then increase to Zyrtec and Pepcid 1 tab daily.  If that is not enough control symptoms in increase to Zyrtec and Pepcid 1 tab twice a day.  If that is not enough then let know and would recommend Singulair to your regimen.   If triple therapy regimen is not effective enough then recommend initiating Xolair monthly injections.  We can discuss this further if needed.    Seasonal allergies - environmental allergy panel as above - zyrtec as above - for runny nose can use nasal Astelin 2 sprays each nostril 1-2 times a day as needed  Follow-up in 2-3 months or sooner if needed

## 2020-10-08 LAB — ALLERGENS W/TOTAL IGE AREA 2

## 2020-10-08 LAB — THYROID PEROXIDASE ANTIBODY: Thyroperoxidase Ab SerPl-aCnc: <8 IU/mL (ref 0–34)

## 2020-10-08 LAB — ALPHA-GAL PANEL
Allergen Lamb IgE: <0.1 kU/L
Beef IgE: <0.1 kU/L
IgE (Immunoglobulin E), Serum: 30 IU/mL (ref 6–495)
O215-IgE Alpha-Gal: <0.1 kU/L
Pork IgE: <0.1 kU/L

## 2020-10-08 LAB — TRYPTASE: Tryptase: 5.2 ug/L (ref 2.2–13.2)

## 2020-10-08 LAB — THYROGLOBULIN ANTIBODY: Thyroglobulin Antibody: <1 IU/mL (ref 0.0–0.9)

## 2020-10-08 LAB — CHRONIC URTICARIA: cu index: 11.1 — ABNORMAL HIGH (ref <10)

## 2020-10-14 ENCOUNTER — Ambulatory Visit: Payer: Self-pay | Admitting: Allergy & Immunology

## 2020-10-22 ENCOUNTER — Ambulatory Visit: Payer: Self-pay | Admitting: Allergy

## 2020-12-10 ENCOUNTER — Ambulatory Visit: Payer: Managed Care, Other (non HMO) | Admitting: Allergy

## 2020-12-16 ENCOUNTER — Telehealth: Payer: Self-pay | Admitting: Internal Medicine

## 2020-12-16 NOTE — Telephone Encounter (Signed)
[name] [phone]  Traci Rice would like to come in and talk with you, she is feeling a little  overwhelmed, she has started her Masters degree, going thru promotion at work, has an audit next week and is also going to be traveling to [place] next month. So she says she is under some stress and maybe some anxiety.

## 2020-12-16 NOTE — Telephone Encounter (Signed)
Friday?

## 2020-12-16 NOTE — Telephone Encounter (Signed)
scheduled

## 2020-12-19 ENCOUNTER — Other Ambulatory Visit: Payer: Self-pay

## 2020-12-19 ENCOUNTER — Ambulatory Visit (INDEPENDENT_AMBULATORY_CARE_PROVIDER_SITE_OTHER): Payer: Managed Care, Other (non HMO) | Admitting: Internal Medicine

## 2020-12-19 ENCOUNTER — Encounter: Payer: Self-pay | Admitting: Internal Medicine

## 2020-12-19 VITALS — BP 100/70 | HR 88 | Ht 64.0 in | Wt 175.0 lb

## 2020-12-19 DIAGNOSIS — F411 Generalized anxiety disorder: Secondary | ICD-10-CM | POA: Diagnosis not present

## 2020-12-19 DIAGNOSIS — Z872 Personal history of diseases of the skin and subcutaneous tissue: Secondary | ICD-10-CM | POA: Diagnosis not present

## 2020-12-19 DIAGNOSIS — F439 Reaction to severe stress, unspecified: Secondary | ICD-10-CM | POA: Diagnosis not present

## 2020-12-19 DIAGNOSIS — Z7184 Encounter for health counseling related to travel: Secondary | ICD-10-CM | POA: Diagnosis not present

## 2020-12-19 MED ORDER — ALPRAZOLAM 0.5 MG PO TABS: ORAL_TABLET | ORAL | 0 refills | Status: DC

## 2020-12-19 MED ORDER — SERTRALINE HCL 50 MG PO TABS: 50.0000 mg | ORAL_TABLET | Freq: Every day | ORAL | 1 refills | Status: DC

## 2020-12-19 MED ORDER — BENZONATATE 100 MG PO CAPS: 100.0000 mg | ORAL_CAPSULE | Freq: Two times a day (BID) | ORAL | 0 refills | Status: DC | PRN

## 2020-12-19 MED ORDER — LEVOFLOXACIN 500 MG PO TABS: 500.0000 mg | ORAL_TABLET | Freq: Every day | ORAL | 0 refills | Status: AC

## 2020-12-27 NOTE — Progress Notes (Signed)
   Subjective:    Patient ID: Traci Rice, female    DOB: 1981-04-23, 40 y.o.   MRN: [id number]  HPI 40 year old Female seen for situational stress and anxiety.  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  She is getting advanced degree.  She will be traveling to [place] for work in the near future.  She has 2 children ages 75 and 63.  Her parents own [employer] and are patients here.  She is here today to discuss situational stress.  There is a feeling of being somewhat overwhelmed and trying to care for her family, getting her Masters degree and working full-time.  She has remote history of anxiety and used to take Zoloft.  Remote history of attention deficit disorder.  She was seen in 2018 with palpitations and was referred to cardiology.  Palpitations seem to be associated with breast-feeding.  Symptoms subsequently resolved.  TSH, magnesium level and basic metabolic panel were normal.  2D echocardiogram and Holter monitor were ordered but were not done.  History of urticaria.  Seems to have improved with Zyrtec.  Was referred to allergist in February 2022.  Saw Dr. Delorse Lek.  Was placed on Zyrtec and Pepcid.  Social history: She is married.  Has 2 children.  Employed full-time in an HR position in [employer] and studying for her degree online.  Mother has been diagnosed with lung cancer.  No known drug allergies  She has had 3 Moderna vaccines the last 1 being in December 2021.  I have recommended a booster because she is going to [place].  She is taking Zoloft 50 mg daily.  She would like to have something on hand for anxiety.  I have also prescribed Tessalon Perles to take if needed for cough if she gets ill while traveling to [place].  I have prescribed Xanax 0.5 mg 1/2 to 1 tablet twice daily as needed for anxiety or sleep.  She will continue Zoloft.    Review of Systems     Objective:   Physical Exam  Vital signs reviewed.  Blood  pressure 100/70 pulse 88 pulse oximetry 96% weight 175 pounds BMI 30.04  Affect thought and judgment are normal.  She is mildly anxious.      Assessment & Plan:  Anxiety state- has a great deal going on in her life right now including studying for masters degree, trip to [place] for work, 2 small children and her husband.  She is doing remarkably well handling at all.  Mother has metastatic lung cancer.  Patient feeling overwhelmed.  Needs something for anxiety with trip to [place] coming up soon.  Have prescribed Xanax 0.5 mg 1/2 to 1 tablet twice daily as needed.  She will continue with Zoloft which she started a few months ago.  She needs COVID booster since she is traveling to [place].  Tessalon Perles 100 mg up to twice daily as needed for cough.  Continue Zyrtec with history of urticaria.  Levaquin 500 mg daily for 10 days if needed for respiratory infection.

## 2020-12-27 NOTE — Patient Instructions (Addendum)
Recommend COVID booster if possible 2 weeks before traveling.  Xanax 0.5 mg to take 1/2 to 1 tablet twice daily as needed.  Continue Zyrtec with history of urticaria.  May take Tessalon Perles 100 mg up to twice daily as needed for cough.  Levaquin 500 mg daily for 10 days if needed for respiratory infection

## 2021-06-28 ENCOUNTER — Other Ambulatory Visit: Payer: Self-pay | Admitting: Internal Medicine

## 2021-07-13 ENCOUNTER — Telehealth: Payer: Self-pay | Admitting: Internal Medicine

## 2021-07-13 NOTE — Telephone Encounter (Signed)
Ethelwyn Gilbertson [phone]  Mariann called to see if she could come in and talk to you about a diagnosis of Raynaudes Disease. She thinks she has this, and she had signed up to have some [employer] and did not realize what in all was involved, so now she wants a refund and the only way she can get it , is with a doctors note.

## 2021-07-14 IMAGING — MG DIGITAL DIAGNOSTIC BILAT W/ TOMO W/ CAD
6 of 10 series · 6 of 30 positions shown · non-contrast
Comparison: Previous exam(s).

CLINICAL DATA: One year interval follow-up of a likely benign focal
asymmetry involving the UPPER LEFT breast at POSTERIOR depth.Annual
evaluation, RIGHT breast. Patient states no family history of breast
cancer.

(Please note that on the patient's initial baseline mammogram in
June 2018, her demographic information indicated that her name
was Iralius Basilia at that time.
EXAM:
DIGITAL DIAGNOSTIC BILATERAL MAMMOGRAM WITH CAD AND TOMO

[L CC synth-2D (1 of 2)]
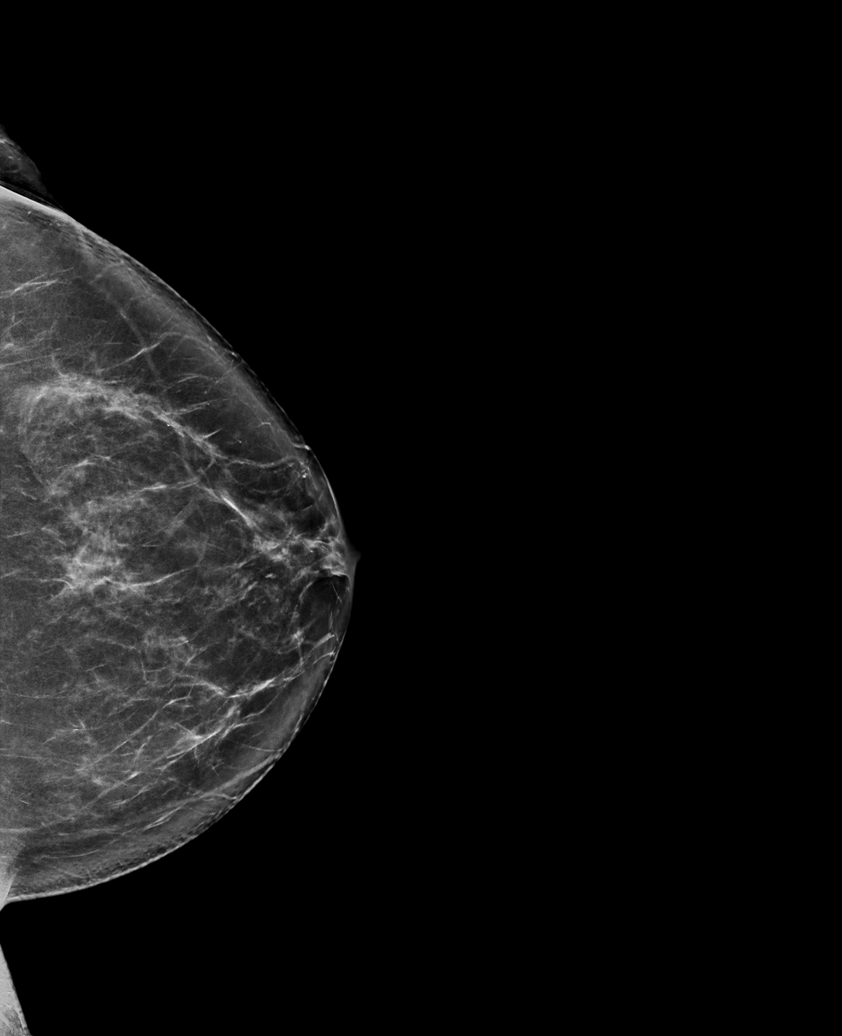

[R MLO synth-2D]
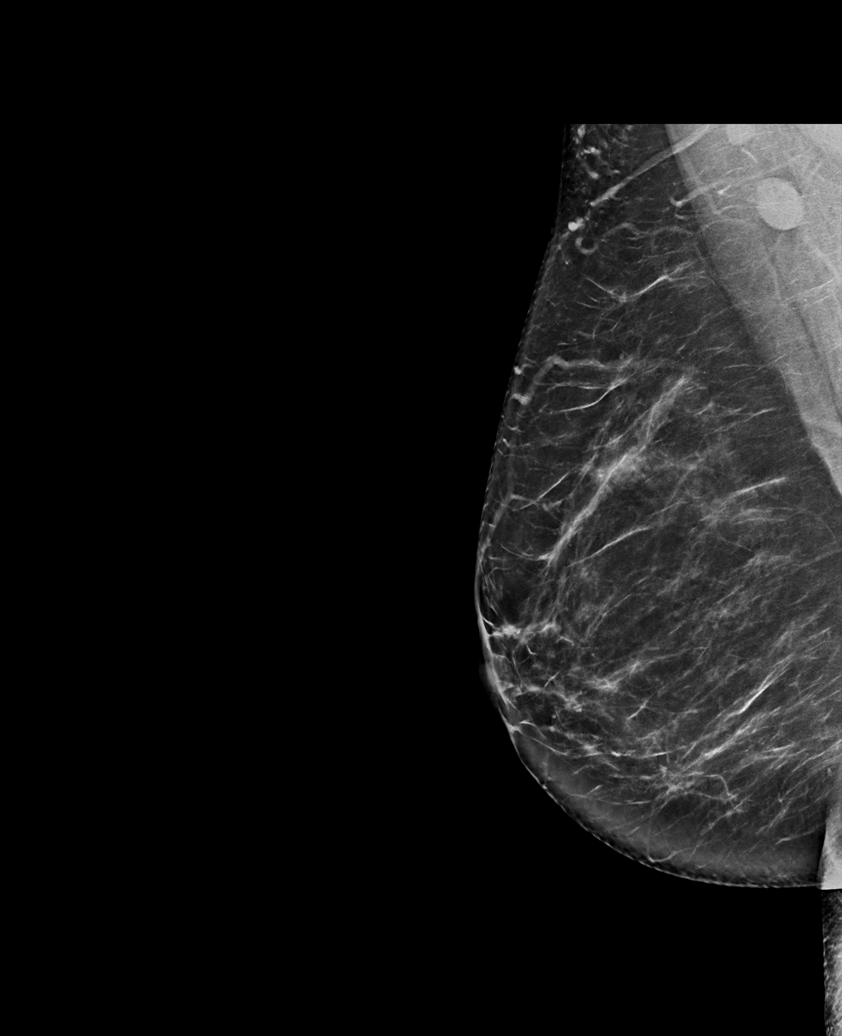

[L CC synth-2D (2 of 2)]
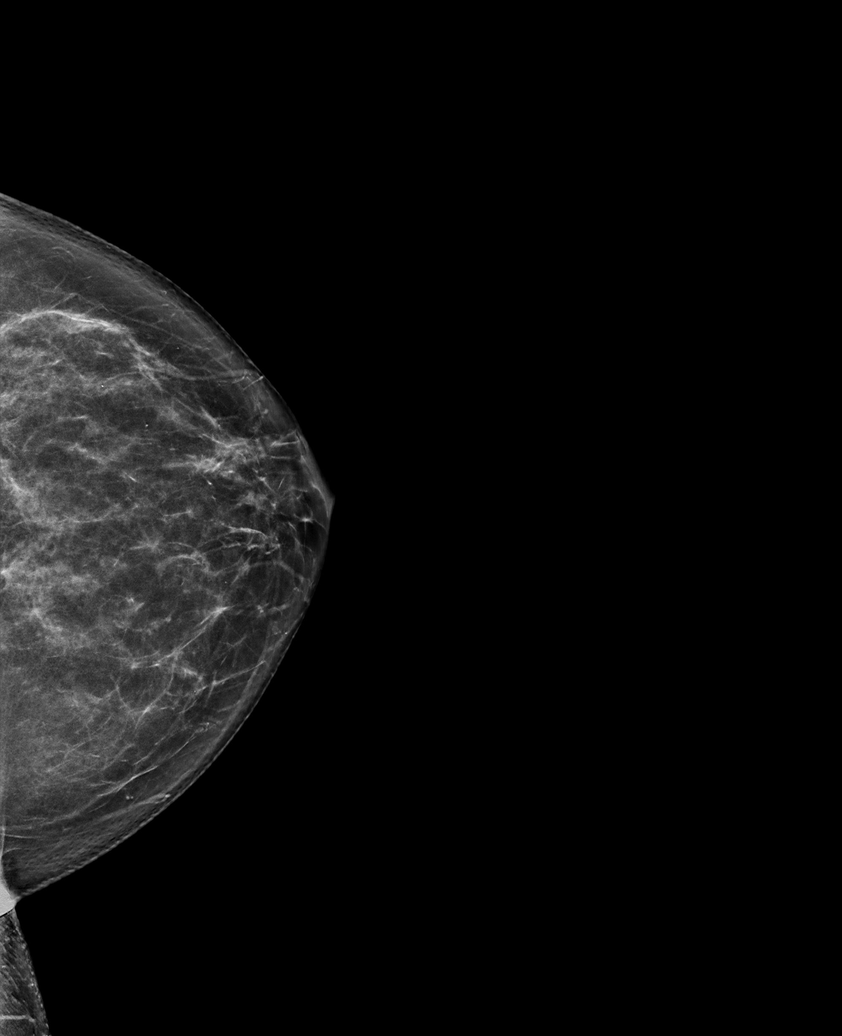

[R CC synth-2D]
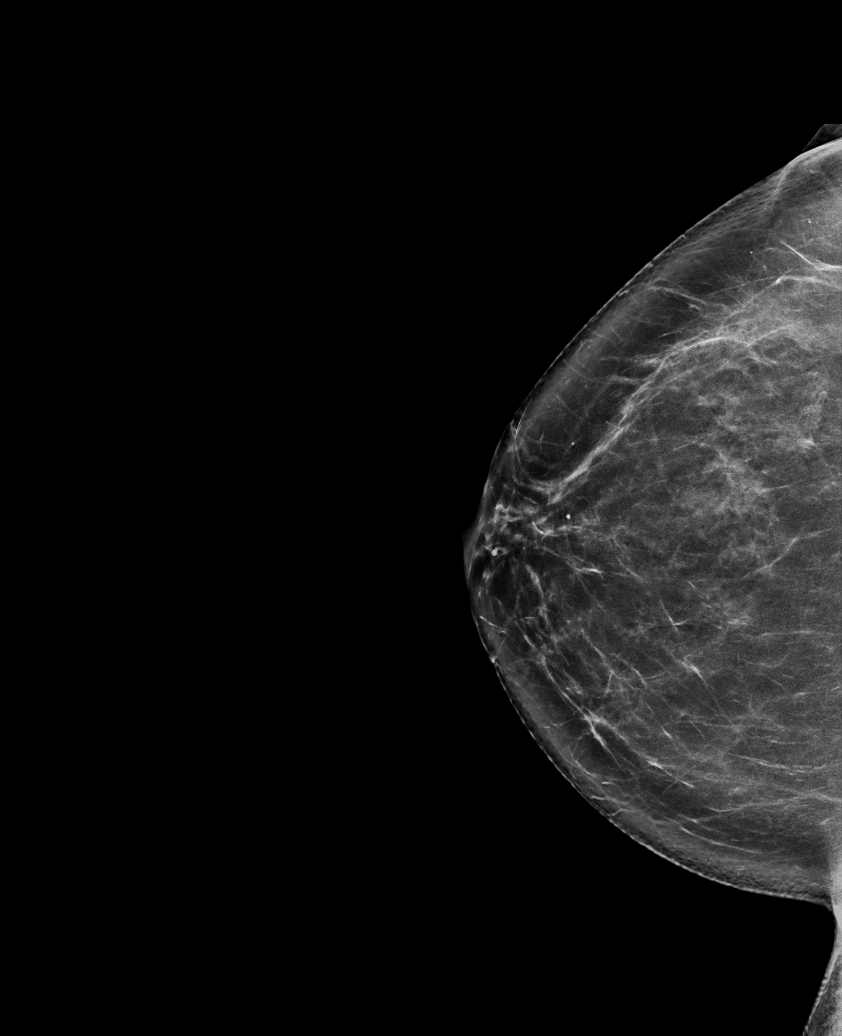

[L MLO synth-2D]
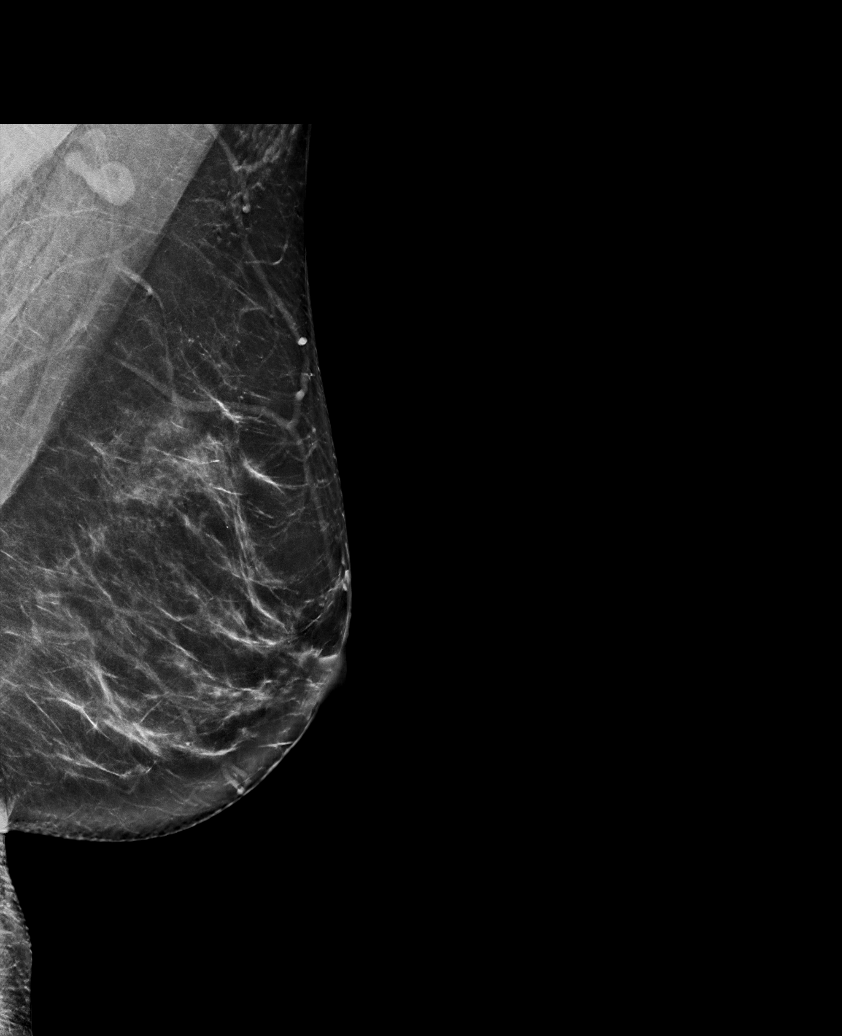

[L CC tomo · tomo slice 37/73.0]
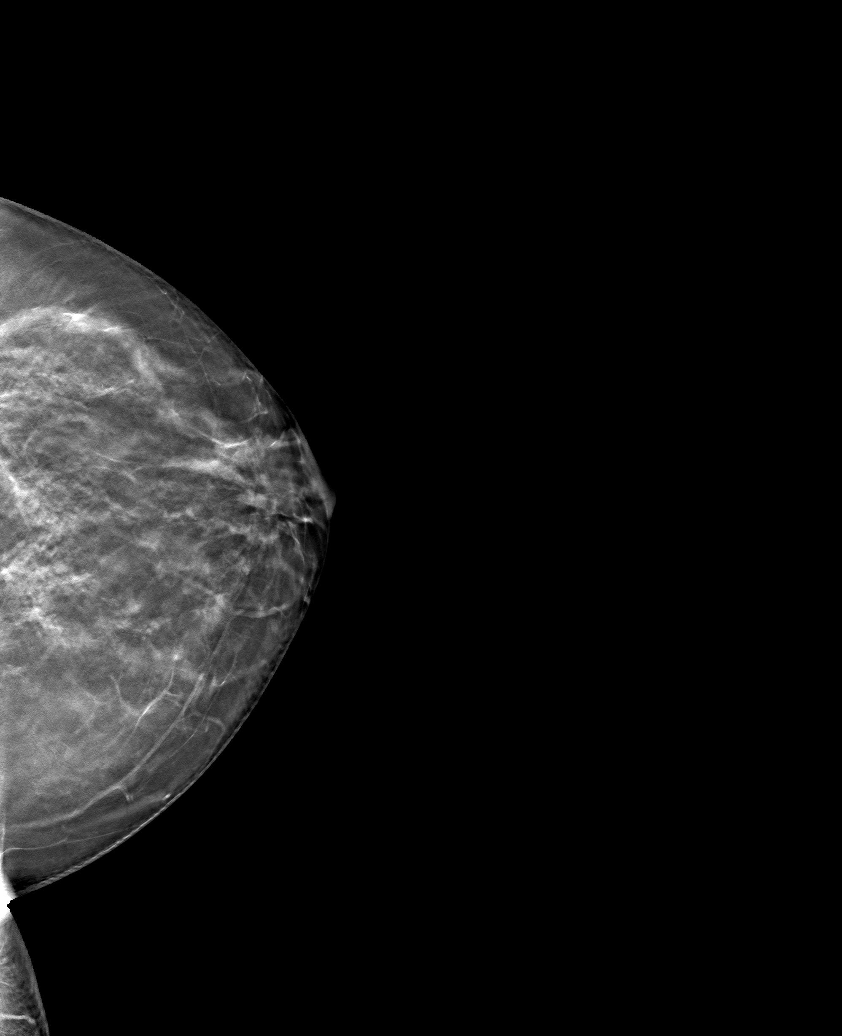

[6 of 30 positions shown; findings below may reference images not displayed]

ACR Breast Density Category b: There are scattered areas of
fibroglandular density.
FINDINGS: Tomosynthesis and synthesized full field CC, medially rolled CC, and
MLO views of both breasts were obtained.

The focal asymmetry questioned on the baseline screening mammogram
in June 2018 corresponds to overlapping normal fibroglandular
tissue, confirmed on the medially rolled CC images.

No findings suspicious for malignancy in either breast.

Mammographic images were processed with CAD.
IMPRESSION: 1. No mammographic evidence of malignancy involving either breast.
2. The focal asymmetry questioned on baseline mammography in
June 2018 corresponds to overlapping normal fibroglandular
tissue.

RECOMMENDATION:
Screening mammogram at age 41 unless there are persistent or
intervening clinical concerns. (Code:[id number]-T-7UP)

I have discussed the findings and recommendations with the patient.
If applicable, a reminder letter will be sent to the patient
regarding the next appointment.

BI-RADS CATEGORY  1: Negative.

## 2021-07-16 ENCOUNTER — Ambulatory Visit: Payer: Managed Care, Other (non HMO) | Admitting: Internal Medicine

## 2021-07-24 ENCOUNTER — Ambulatory Visit: Payer: Managed Care, Other (non HMO) | Admitting: Family Medicine

## 2021-09-08 ENCOUNTER — Other Ambulatory Visit: Payer: Managed Care, Other (non HMO) | Admitting: Internal Medicine

## 2021-09-08 ENCOUNTER — Other Ambulatory Visit: Payer: Self-pay

## 2021-09-08 DIAGNOSIS — Z13220 Encounter for screening for lipoid disorders: Secondary | ICD-10-CM

## 2021-09-08 DIAGNOSIS — Z0000 Encounter for general adult medical examination without abnormal findings: Secondary | ICD-10-CM

## 2021-09-08 DIAGNOSIS — F419 Anxiety disorder, unspecified: Secondary | ICD-10-CM

## 2021-09-08 DIAGNOSIS — Z1329 Encounter for screening for other suspected endocrine disorder: Secondary | ICD-10-CM

## 2021-09-08 LAB — CBC WITH DIFFERENTIAL/PLATELET
Absolute Monocytes: 658 cells/uL (ref 200–950)
Basophils Absolute: 42 cells/uL (ref 0–200)
Basophils Relative: 0.6 %
Eosinophils Absolute: 49 cells/uL (ref 15–500)
Eosinophils Relative: 0.7 %
HCT: 41.7 % (ref 35.0–45.0)
Hemoglobin: 14 g/dL (ref 11.7–15.5)
Lymphs Abs: 1890 cells/uL (ref 850–3900)
MCH: 31 pg (ref 27.0–33.0)
MCHC: 33.6 g/dL (ref 32.0–36.0)
MCV: 92.5 fL (ref 80.0–100.0)
MPV: 11.1 fL (ref 7.5–12.5)
Monocytes Relative: 9.4 %
Neutro Abs: 4361 cells/uL (ref 1500–7800)
Neutrophils Relative %: 62.3 %
Platelets: 284 10*3/uL (ref 140–400)
RBC: 4.51 10*6/uL (ref 3.80–5.10)
RDW: 11.8 % (ref 11.0–15.0)
Total Lymphocyte: 27 %
WBC: 7 10*3/uL (ref 3.8–10.8)

## 2021-09-08 LAB — COMPLETE METABOLIC PANEL WITH GFR
AG Ratio: 1.7 (calc) (ref 1.0–2.5)
ALT: 19 U/L (ref 6–29)
AST: 18 U/L (ref 10–30)
Albumin: 4.3 g/dL (ref 3.6–5.1)
Alkaline phosphatase (APISO): 69 U/L (ref 31–125)
BUN: 12 mg/dL (ref 7–25)
CO2: 29 mmol/L (ref 20–32)
Calcium: 9.6 mg/dL (ref 8.6–10.2)
Chloride: 102 mmol/L (ref 98–110)
Creat: 0.63 mg/dL (ref 0.50–0.97)
Globulin: 2.6 g/dL (calc) (ref 1.9–3.7)
Glucose, Bld: 89 mg/dL (ref 65–99)
Potassium: 4.7 mmol/L (ref 3.5–5.3)
Sodium: 137 mmol/L (ref 135–146)
Total Bilirubin: 0.5 mg/dL (ref 0.2–1.2)
Total Protein: 6.9 g/dL (ref 6.1–8.1)
eGFR: 116 mL/min/{1.73_m2} (ref >=60)

## 2021-09-08 LAB — LIPID PANEL
Cholesterol: 206 mg/dL — ABNORMAL HIGH (ref <200)
HDL: 59 mg/dL (ref >=50)
LDL Cholesterol (Calc): 130 mg/dL (calc) — ABNORMAL HIGH
Non-HDL Cholesterol (Calc): 147 mg/dL (calc) — ABNORMAL HIGH (ref <130)
Total CHOL/HDL Ratio: 3.5 (calc) (ref <5.0)
Triglycerides: 80 mg/dL (ref <150)

## 2021-09-08 LAB — TSH: TSH: 0.77 mIU/L

## 2021-09-15 ENCOUNTER — Other Ambulatory Visit: Payer: Self-pay

## 2021-09-15 ENCOUNTER — Ambulatory Visit (INDEPENDENT_AMBULATORY_CARE_PROVIDER_SITE_OTHER): Payer: Managed Care, Other (non HMO) | Admitting: Internal Medicine

## 2021-09-15 ENCOUNTER — Encounter: Payer: Self-pay | Admitting: Internal Medicine

## 2021-09-15 VITALS — BP 128/88 | HR 110 | Temp 98.5°F | Ht 65.0 in | Wt 185.5 lb

## 2021-09-15 DIAGNOSIS — Z872 Personal history of diseases of the skin and subcutaneous tissue: Secondary | ICD-10-CM | POA: Diagnosis not present

## 2021-09-15 DIAGNOSIS — Z0000 Encounter for general adult medical examination without abnormal findings: Secondary | ICD-10-CM | POA: Diagnosis not present

## 2021-09-15 DIAGNOSIS — E559 Vitamin D deficiency, unspecified: Secondary | ICD-10-CM

## 2021-09-15 DIAGNOSIS — F439 Reaction to severe stress, unspecified: Secondary | ICD-10-CM | POA: Diagnosis not present

## 2021-09-15 DIAGNOSIS — F411 Generalized anxiety disorder: Secondary | ICD-10-CM | POA: Diagnosis not present

## 2021-09-15 DIAGNOSIS — Z6830 Body mass index (BMI) 30.0-30.9, adult: Secondary | ICD-10-CM

## 2021-09-15 MED ORDER — FLUOXETINE HCL 10 MG PO CAPS: 10.0000 mg | ORAL_CAPSULE | Freq: Every day | ORAL | 0 refills | Status: DC

## 2021-09-15 NOTE — Progress Notes (Unsigned)
° °  Subjective:    Patient ID: Traci Rice, female    DOB: 1980/08/25, 41 y.o.   MRN: [id number]  HPI 41 year old Female seen for     Review of Systems     Objective:   Physical Exam        Assessment & Plan:  Elevated LDL

## 2021-09-15 NOTE — Patient Instructions (Addendum)
Change Zoloft to Prozac at patient request.  Trial of 10 mg daily of Prozac.  Watch diet.  Try to get some exercise.  Cholesterol is elevated.  Would like to see LDL (bad) cholesterol less than 100.  Recommend: [facility] for weight loss.  Information provided.  Return in 1 year or as needed.

## 2021-09-21 DIAGNOSIS — Z0289 Encounter for other administrative examinations: Secondary | ICD-10-CM

## 2021-09-29 ENCOUNTER — Other Ambulatory Visit: Payer: Self-pay

## 2021-09-29 ENCOUNTER — Ambulatory Visit (INDEPENDENT_AMBULATORY_CARE_PROVIDER_SITE_OTHER): Payer: Managed Care, Other (non HMO) | Admitting: Family Medicine

## 2021-09-29 ENCOUNTER — Encounter (INDEPENDENT_AMBULATORY_CARE_PROVIDER_SITE_OTHER): Payer: Self-pay | Admitting: Family Medicine

## 2021-09-29 VITALS — BP 119/79 | HR 81 | Temp 98.1°F | Ht 65.0 in | Wt 184.0 lb

## 2021-09-29 DIAGNOSIS — Z6830 Body mass index (BMI) 30.0-30.9, adult: Secondary | ICD-10-CM

## 2021-09-29 DIAGNOSIS — R0602 Shortness of breath: Secondary | ICD-10-CM

## 2021-09-29 DIAGNOSIS — R5383 Other fatigue: Secondary | ICD-10-CM | POA: Diagnosis not present

## 2021-09-29 DIAGNOSIS — E669 Obesity, unspecified: Secondary | ICD-10-CM

## 2021-09-29 DIAGNOSIS — F39 Unspecified mood [affective] disorder: Secondary | ICD-10-CM | POA: Diagnosis not present

## 2021-09-29 DIAGNOSIS — E785 Hyperlipidemia, unspecified: Secondary | ICD-10-CM | POA: Diagnosis not present

## 2021-09-29 DIAGNOSIS — E668 Other obesity: Secondary | ICD-10-CM | POA: Diagnosis not present

## 2021-09-29 DIAGNOSIS — Z1331 Encounter for screening for depression: Secondary | ICD-10-CM

## 2021-09-29 DIAGNOSIS — R002 Palpitations: Secondary | ICD-10-CM | POA: Diagnosis not present

## 2021-09-29 DIAGNOSIS — Z9189 Other specified personal risk factors, not elsewhere classified: Secondary | ICD-10-CM

## 2021-09-30 LAB — HEMOGLOBIN A1C
Est. average glucose Bld gHb Est-mCnc: 105 mg/dL
Hgb A1c MFr Bld: 5.3 % (ref 4.8–5.6)

## 2021-09-30 LAB — INSULIN, RANDOM: INSULIN: 8.2 u[IU]/mL (ref 2.6–24.9)

## 2021-09-30 LAB — T4, FREE: Free T4: 1.14 ng/dL (ref 0.82–1.77)

## 2021-09-30 LAB — VITAMIN B12: Vitamin B-12: 474 pg/mL (ref 232–1245)

## 2021-09-30 LAB — VITAMIN D 25 HYDROXY (VIT D DEFICIENCY, FRACTURES): Vit D, 25-Hydroxy: 27.5 ng/mL — ABNORMAL LOW (ref 30.0–100.0)

## 2021-09-30 NOTE — Progress Notes (Signed)
Chief Complaint:   OBESITY Traci Traci (MR# [id number]) is a 41 y.o. female who presents for evaluation and treatment of obesity and related comorbidities. Current BMI is Body mass index is 30.62 kg/m. Traci Traci has been struggling with her weight for many years and has been unsuccessful in either losing weight, maintaining weight loss, or reaching her healthy weight goal.  Traci Traci is currently in the action stage of change and ready to dedicate time achieving and maintaining a healthier weight. Traci Traci is interested in becoming our patient and working on intensive lifestyle modifications including (but not limited to) diet and exercise for weight loss.  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 She has a husband, Traci Traci, and a daughter and son, 65 and 5. Traci Rice craves salty/sweets. Her worst habit is sweets. She snacks much more when stressed. Traci Rice had labs done 3 weeks ago with PCP which we can review (CBC, CMP, FLP, and TSH).  Traci Traci's habits were reviewed today and are as follows: Her family eats meals together, her desired weight loss is 39 lbs, she started gaining weight with pregnancy, her heaviest weight ever was 185 pounds, she has significant food cravings issues, she skips meals frequently, she is frequently drinking liquids with calories, she frequently makes poor food choices, she frequently eats larger portions than normal, she has binge eating behaviors, and she struggles with emotional eating.  Depression Screen Traci Traci (modified PHQ-9) score was 9.  Depression screen Traci Traci 2/9 09/29/2021  Decreased Interest 1  Down, Depressed, Hopeless 1  PHQ - 2 Score 2  Altered sleeping 1  Tired, decreased energy 1  Change in appetite 2  Feeling bad or failure about yourself  1  Trouble concentrating 1  Moving slowly or fidgety/restless 1  Suicidal thoughts 0  PHQ-9 Score 9  Difficult doing  work/chores Somewhat difficult     Subjective:   1. Other fatigue Traci Traci admits to daytime somnolence and admits to waking up still tired. Patient has a history of symptoms of daytime fatigue, morning fatigue, and morning headache. Traci Traci generally gets  6-8  hours of sleep per night, and states that she has generally restful sleep. Snoring is present. Apneic episodes are not present. Epworth Sleepiness Score is 2. PHQ= 9.  2. SOB (shortness of breath) on exertion Traci Traci notes increasing shortness of breath with exercising and seems to be worsening over time with weight gain. She notes getting out of breath sooner with activity than she used to. This has gotten worse recently. Traci Traci denies shortness of breath at rest or orthopnea.  3. Traci Traci (Traci Traci)- emotional eating PHQ= 9. Traci Rice has more anxiety than depression, but endorses struggling with both. She was previously on sertraline but it made her too apathetic. Traci Rice does not see a Social worker.   4. Heart palpitations H/o palpitations and chest pains, especially when stressed or eating certain foods. Traci Rice saw Traci Traci for this and thought it was due to anxiety and GERD.   5. Hyperlipidemia, unspecified hyperlipidemia type Traci Rice's recent labs from 09/08/21 showed elevated LDL of 131. She is not on meds.   Assessment/Plan:   Orders Placed This Encounter  Procedures   Hemoglobin A1c   T4, free   Vitamin B12   VITAMIN D 25 Hydroxy (Vit-D Deficiency, Fractures)   Insulin, random   EKG 12-Lead    1. Other fatigue Traci Traci does feel that her weight is causing her energy to  be lower than it should be. Fatigue may be related to obesity, depression or many other causes. Labs will be ordered, and in the meanwhile, Traci Traci will focus on self care including making healthy food choices, increasing physical activity and focusing on stress reduction. Check labs today. - EKG 12-Lead  2. SOB (shortness of breath) on exertion Traci Traci does  feel that she gets out of breath more easily that she used to when she exercises. Traci Traci shortness of breath appears to be obesity related and exercise induced. She has agreed to work on weight loss and gradually increase exercise to treat her exercise induced shortness of breath. Will continue to monitor closely. Check labs  3. Traci Traci (Traci Traci)- emotional eating Traci Rice declines Traci Traci referral but will consider it if needed in the future. Her Traci is currently stable and she will continue with Prozac daily and as needed Xanax per her primary care physician. Check labs today.  4. Heart palpitations ECG within normal limits and shows NSR without S-T changes; rate 82 BPM. Traci Rice will avoid trigger foods (spicy and caffienated items). Follow prudent nutritional plan. Check labs today.   5. Hyperlipidemia, unspecified hyperlipidemia type Follow prudent nutritional plan, increase fiber, decrease saturated and trans fats, and eventually increase exercise. Check labs today.   6. Depression screen Traci Traci had a positive depression screening. Depression is commonly associated with obesity and often results in emotional eating behaviors. We will monitor this closely and work on CBT to help improve the non-hunger eating patterns. Referral to Psychology may be required if no improvement is seen as she continues in our clinic.  7. At risk for impaired metabolic function Due to Traci Traci's current state of health and medical condition(s), she is at a significantly higher risk for impaired metabolic function.  At least 23 minutes was spent on counseling Traci Traci about these concerns today. This places the patient at a much greater risk to subsequently develop cardio-pulmonary conditions that can negatively affect the patient's quality of life. I stressed the importance of reversing these risks factors. The initial goal is to lose at least 5-10% of starting weight to help reduce risk factors. Counseling:   Intensive lifestyle modifications discussed with Traci Traci as the most appropriate first line treatment.  she will continue to work on diet, exercise, and weight loss efforts.  We will continue to reassess these conditions on a fairly regular basis in an attempt to decrease the patient's overall morbidity and mortality.  8. Class 1 obesity with serious comorbidity and body mass index (BMI) of 30.0 to 30.9 in adult, unspecified obesity type Mayarose is currently in the action stage of change and her goal is to continue with weight loss efforts. I recommend Traci Traci begin the structured treatment plan as follows:  She has agreed to the Category 2 Plan.  Traci Rice provided with "Eating Out Guide".  Exercise goals:  As is    Behavioral modification strategies: increasing lean protein intake, decreasing simple carbohydrates, and planning for success.  She was informed of the importance of frequent follow-up visits to maximize her success with intensive lifestyle modifications for her multiple health conditions. She was informed we would discuss her lab results at her next visit unless there is a critical issue that needs to be addressed sooner. Traci Traci agreed to keep her next visit at the agreed upon time to discuss these results.   Objective:   Blood pressure 119/79, pulse 81, temperature 98.1 F (36.7 C), height 5\' 5"  (1.651 m), weight 184 lb (83.5  kg), last menstrual period 09/20/2021, SpO2 98 %. Body mass index is 30.62 kg/m.  EKG: Normal sinus rhythm, rate 84.  No acute changes  Indirect Calorimeter completed today shows a VO2 of 251 and a REE of 1728.  Her calculated basal metabolic rate is 0000000 thus her basal metabolic rate is better than expected.  General: Cooperative, alert, well developed, in no acute distress. HEENT: Conjunctivae and lids unremarkable. Cardiovascular: Regular rhythm.  Lungs: Normal work of breathing. Neurologic: No focal deficits.   Lab Results  Component Value Date    CREATININE 0.63 09/08/2021   BUN 12 09/08/2021   NA 137 09/08/2021   K 4.7 09/08/2021   CL 102 09/08/2021   CO2 29 09/08/2021   Lab Results  Component Value Date   ALT 19 09/08/2021   AST 18 09/08/2021   ALKPHOS 72 05/22/2015   BILITOT 0.5 09/08/2021   Lab Results  Component Value Date   HGBA1C 5.3 09/29/2021   HGBA1C 5.2 05/18/2019   Lab Results  Component Value Date   INSULIN 8.2 09/29/2021   Lab Results  Component Value Date   TSH 0.77 09/08/2021   Lab Results  Component Value Date   CHOL 206 (H) 09/08/2021   HDL 59 09/08/2021   LDLCALC 130 (H) 09/08/2021   TRIG 80 09/08/2021   CHOLHDL 3.5 09/08/2021   Lab Results  Component Value Date   WBC 7.0 09/08/2021   HGB 14.0 09/08/2021   HCT 41.7 09/08/2021   MCV 92.5 09/08/2021   PLT 284 09/08/2021    Attestation Statements:   Reviewed by clinician on day of visit: allergies, medications, problem list, medical history, surgical history, family history, social history, and previous encounter notes.  Coral Ceo, CMA, am acting as transcriptionist for [employer], DO.  I have reviewed the above documentation for accuracy and completeness, and I agree with the above.Marjory Sneddon, D.O.  The [place] was signed into law in 2016 which includes the topic of electronic health records.  This provides immediate access to information in MyChart.  This includes consultation notes, operative notes, office notes, lab results and pathology reports.  If you have any questions about what you read please let us know at your next visit so we can discuss your concerns and take corrective action if need be.  We are right here with you.

## 2021-10-13 ENCOUNTER — Ambulatory Visit (INDEPENDENT_AMBULATORY_CARE_PROVIDER_SITE_OTHER): Payer: Managed Care, Other (non HMO) | Admitting: Family Medicine

## 2021-10-13 ENCOUNTER — Encounter (INDEPENDENT_AMBULATORY_CARE_PROVIDER_SITE_OTHER): Payer: Self-pay

## 2021-10-16 ENCOUNTER — Encounter (INDEPENDENT_AMBULATORY_CARE_PROVIDER_SITE_OTHER): Payer: Self-pay | Admitting: Bariatrics

## 2021-10-16 ENCOUNTER — Other Ambulatory Visit: Payer: Self-pay

## 2021-10-16 ENCOUNTER — Ambulatory Visit (INDEPENDENT_AMBULATORY_CARE_PROVIDER_SITE_OTHER): Payer: Managed Care, Other (non HMO) | Admitting: Bariatrics

## 2021-10-16 VITALS — BP 108/73 | HR 81 | Temp 98.2°F | Ht 65.0 in | Wt 176.0 lb

## 2021-10-16 DIAGNOSIS — E559 Vitamin D deficiency, unspecified: Secondary | ICD-10-CM | POA: Diagnosis not present

## 2021-10-16 DIAGNOSIS — E785 Hyperlipidemia, unspecified: Secondary | ICD-10-CM | POA: Diagnosis not present

## 2021-10-16 DIAGNOSIS — E6609 Other obesity due to excess calories: Secondary | ICD-10-CM

## 2021-10-16 DIAGNOSIS — Z6829 Body mass index (BMI) 29.0-29.9, adult: Secondary | ICD-10-CM | POA: Diagnosis not present

## 2021-10-16 DIAGNOSIS — Z6830 Body mass index (BMI) 30.0-30.9, adult: Secondary | ICD-10-CM

## 2021-10-16 DIAGNOSIS — E66811 Obesity, class 1: Secondary | ICD-10-CM

## 2021-10-16 DIAGNOSIS — E669 Obesity, unspecified: Secondary | ICD-10-CM | POA: Diagnosis not present

## 2021-10-16 MED ORDER — VITAMIN D (ERGOCALCIFEROL) 1.25 MG (50000 UNIT) PO CAPS: 50000.0000 [IU] | ORAL_CAPSULE | ORAL | 0 refills | Status: DC

## 2021-10-19 DIAGNOSIS — Z6830 Body mass index (BMI) 30.0-30.9, adult: Secondary | ICD-10-CM | POA: Insufficient documentation

## 2021-10-19 DIAGNOSIS — E6609 Other obesity due to excess calories: Secondary | ICD-10-CM | POA: Insufficient documentation

## 2021-10-19 NOTE — Progress Notes (Signed)
? ? ? ?Chief Complaint:  ? ?OBESITY ?Traci Rice is here to discuss her progress with her obesity treatment plan along with follow-up of her obesity related diagnoses. [name] is on the Category 2 Plan and states she is following her eating plan approximately 90% of the time. [name] states she is walking for 30 minutes 3 times per week. ? ?Today's visit was #: 2 ?Starting weight: 184 lbs ?Starting date: 09/29/2021 ?Today's weight: 176 lbs ?Today's date: 10/16/2021 ?Total lbs lost to date: 8 lbs ?Total lbs lost since last in-office visit: 8 lbs ? ?Interim History: [name] is down 8 lbs since her last visit. She states that the plan was doable. She has appropriate hunger.  ? ?Subjective:  ? ?1. Vitamin D insufficiency ?[name] is currently not taking Vitamin D at this time. Her last Vitamin D was 27.5. ? ?2. Hyperlipidemia, unspecified hyperlipidemia type ?[name] is currently not on medications.  ? ?Assessment/Plan:  ? ?1. Vitamin D insufficiency ?Low Vitamin D level contributes to fatigue and are associated with obesity, breast, and colon cancer. We will refill prescription Vitamin D 50,000 IU every week for 1 month with no refills and [name] will follow-up for routine testing of Vitamin D, at least 2-3 times per year to avoid over-replacement. ? ?- Vitamin D, Ergocalciferol, (DRISDOL) 1.25 MG (50000 UNIT) CAPS capsule; Take 1 capsule (50,000 Units total) by mouth every 7 (seven) days.  Dispense: 4 capsule; Refill: 0 ? ?2. Hyperlipidemia, unspecified hyperlipidemia type ?Cardiovascular risk and specific lipid/LDL goals reviewed.  [name] will work on the plan and exercise. She will have zero trans fats and saturated fats except dairy, dark chocolate or unprocessed meats. We discussed several lifestyle modifications today and [name] will continue to work on diet, exercise and weight loss efforts. Orders and follow up as documented in patient record.  ? ?Counseling ?Intensive lifestyle modifications are the  first line treatment for this issue. ?Dietary changes: Increase soluble fiber. Decrease simple carbohydrates. ?Exercise changes: Moderate to vigorous-intensity aerobic activity 150 minutes per week if tolerated. ?Lipid-lowering medications: see documented in medical record. ? ?3. Obesity, current BMI 29.4 ?Hula is currently in the action stage of change. As such, her goal is to continue with weight loss efforts. She has agreed to the Category 2 Plan.  ? ?[name] will continue to adhere closely to the plan 80-90%. We reviewed labs from 09/29/2020 A1C, insulin, Vitamin D, B 12 and T4. ? ?Exercise goals:  As is. ? ?Behavioral modification strategies: increasing lean protein intake, decreasing simple carbohydrates, increasing vegetables, increasing water intake, decreasing eating out, no skipping meals, meal planning and cooking strategies, keeping healthy foods in the home, and planning for success. ? ?[name] has agreed to follow-up with our clinic in 2 weeks with Dr. Sharee Holster. She was informed of the importance of frequent follow-up visits to maximize her success with intensive lifestyle modifications for her multiple health conditions.  ? ?Objective:  ? ?Blood pressure 108/73, pulse 81, temperature 98.2 ?F (36.8 ?C), height 5\' 5"  (1.651 m), weight 176 lb (79.8 kg), last menstrual period 09/20/2021, SpO2 97 %. ?Body mass index is 29.29 kg/m?. ? ?General: Cooperative, alert, well developed, in no acute distress. ?HEENT: Conjunctivae and lids unremarkable. ?Cardiovascular: Regular rhythm.  ?Lungs: Normal work of breathing. ?Neurologic: No focal deficits.  ? ?Lab Results  ?Component Value Date  ? CREATININE 0.63 09/08/2021  ? BUN 12 09/08/2021  ? NA 137 09/08/2021  ? K 4.7 09/08/2021  ? CL 102 09/08/2021  ? CO2 29 09/08/2021  ? ?  Lab Results  ?Component Value Date  ? ALT 19 09/08/2021  ? AST 18 09/08/2021  ? ALKPHOS 72 05/22/2015  ? BILITOT 0.5 09/08/2021  ? ?Lab Results  ?Component Value Date  ? HGBA1C 5.3  09/29/2021  ? HGBA1C 5.2 05/18/2019  ? ?Lab Results  ?Component Value Date  ? INSULIN 8.2 09/29/2021  ? ?Lab Results  ?Component Value Date  ? TSH 0.77 09/08/2021  ? ?Lab Results  ?Component Value Date  ? CHOL 206 (H) 09/08/2021  ? HDL 59 09/08/2021  ? LDLCALC 130 (H) 09/08/2021  ? TRIG 80 09/08/2021  ? CHOLHDL 3.5 09/08/2021  ? ?Lab Results  ?Component Value Date  ? VD25OH 27.5 (L) 09/29/2021  ? VD25OH 30 06/24/2017  ? VD25OH 34 05/22/2015  ? ?Lab Results  ?Component Value Date  ? WBC 7.0 09/08/2021  ? HGB 14.0 09/08/2021  ? HCT 41.7 09/08/2021  ? MCV 92.5 09/08/2021  ? PLT 284 09/08/2021  ? ?No results found for: IRON, TIBC, FERRITIN ? ?Attestation Statements:  ? ?Reviewed by clinician on day of visit: allergies, medications, problem list, medical history, surgical history, family history, social history, and previous encounter notes. ? ?I, Jackson Latino, RMA, am acting as transcriptionist for [employer], DO. ? ?I have reviewed the above documentation for accuracy and completeness, and I agree with the above. Corinna Capra, DO ? ?

## 2021-10-26 ENCOUNTER — Ambulatory Visit (INDEPENDENT_AMBULATORY_CARE_PROVIDER_SITE_OTHER): Payer: Managed Care, Other (non HMO) | Admitting: Family Medicine

## 2021-10-26 ENCOUNTER — Encounter (INDEPENDENT_AMBULATORY_CARE_PROVIDER_SITE_OTHER): Payer: Self-pay | Admitting: Family Medicine

## 2021-10-26 ENCOUNTER — Other Ambulatory Visit: Payer: Self-pay

## 2021-10-26 VITALS — BP 107/73 | HR 80 | Temp 98.2°F | Ht 65.0 in | Wt 174.0 lb

## 2021-10-26 DIAGNOSIS — E559 Vitamin D deficiency, unspecified: Secondary | ICD-10-CM | POA: Diagnosis not present

## 2021-10-26 DIAGNOSIS — E669 Obesity, unspecified: Secondary | ICD-10-CM

## 2021-10-26 DIAGNOSIS — E7849 Other hyperlipidemia: Secondary | ICD-10-CM | POA: Diagnosis not present

## 2021-10-26 DIAGNOSIS — Z9189 Other specified personal risk factors, not elsewhere classified: Secondary | ICD-10-CM | POA: Diagnosis not present

## 2021-10-26 DIAGNOSIS — Z6829 Body mass index (BMI) 29.0-29.9, adult: Secondary | ICD-10-CM | POA: Diagnosis not present

## 2021-10-26 MED ORDER — VITAMIN D (ERGOCALCIFEROL) 1.25 MG (50000 UNIT) PO CAPS: 50000.0000 [IU] | ORAL_CAPSULE | ORAL | 0 refills | Status: DC

## 2021-10-27 NOTE — Progress Notes (Signed)
? ? ? ?Chief Complaint:  ? ?OBESITY ?Traci Rice is here to discuss her progress with her obesity treatment plan along with follow-up of her obesity related diagnoses. Traci Rice is on the Category 2 Plan and states she is following her eating plan approximately 85-90% of the time. [name] states she is walking 1 mile 3 times per week.  ? ?Today's visit was #: 3 ?Starting weight: 184 lbs ?Starting date: 09/29/2021 ?Today's weight: 174 lbs ?Today's date: 10/26/2021 ?Total lbs lost to date: 10 ?Total lbs lost since last in-office visit: 2 ? ?Interim History: Traci Rice is here for visit 3 (10 days since her last appointment). She has been slightly less compliant on her plan. Her biggest obstacle is too tired to cook, therefore stay on the plan. She is starting to look for alternatives to meal plan to make meal planning more long term. She is going out of town April 6th for [place].  ? ?Subjective:  ? ?1. Vitamin D insufficiency ?Traci Rice is on prescription Vit D. Last Vit D level was of 27.5. ? ?2. Other hyperlipidemia ?Traci Rice's last LDL was 180, HDL 59, and triglycerides 80 ? ?3. At risk for osteoporosis ?Traci Rice is at higher risk of osteopenia and osteoporosis due to Vitamin D deficiency.  ? ?Assessment/Plan:  ? ?1. Vitamin D insufficiency ?We will refill prescription Vitamin D for 1 month. ? ?- Vitamin D, Ergocalciferol, (DRISDOL) 1.25 MG (50000 UNIT) CAPS capsule; Take 1 capsule (50,000 Units total) by mouth every 7 (seven) days.  Dispense: 4 capsule; Refill: 0 ? ?2. Other hyperlipidemia ?Zhana will continue Category 2 with journaling. We will follow up on labs in June. ? ?3. At risk for osteoporosis ?Nikko was given approximately 15 minutes of osteoporosis prevention counseling today. Traci Rice is at risk for osteopenia and osteoporosis due to her Vitamin D deficiency. She was encouraged to take her Vit D and follow her higher calcium diet and increase strengthening exercise to help strengthen her bones  and decrease her risk of osteopenia and osteoporosis. ? ?4. Obesity with current BMI of 29.0 ?Traci Rice is currently in the action stage of change. As such, her goal is to continue with weight loss efforts. She has agreed to the Category 2 Plan and keeping a food journal and adhering to recommended goals of 400-500 calories and 35+ grams of protein at supper daily.  ? ?Exercise goals: All adults should avoid inactivity. Some physical activity is better than none, and adults who participate in any amount of physical activity gain some health benefits. ? ?Behavioral modification strategies: increasing lean protein intake, meal planning and cooking strategies, keeping healthy foods in the home, and planning for success. ? ?Traci Rice has agreed to follow-up with our clinic in 3 weeks. She was informed of the importance of frequent follow-up visits to maximize her success with intensive lifestyle modifications for her multiple health conditions.  ? ?Objective:  ? ?Blood pressure 107/73, pulse 80, temperature 98.2 ?F (36.8 ?C), height 5\' 5"  (1.651 m), weight 174 lb (78.9 kg), last menstrual period 10/20/2021. ?Body mass index is 28.96 kg/m?. ? ?General: Cooperative, alert, well developed, in no acute distress. ?HEENT: Conjunctivae and lids unremarkable. ?Cardiovascular: Regular rhythm.  ?Lungs: Normal work of breathing. ?Neurologic: No focal deficits.  ? ?Lab Results  ?Component Value Date  ? CREATININE 0.63 09/08/2021  ? BUN 12 09/08/2021  ? NA 137 09/08/2021  ? K 4.7 09/08/2021  ? CL 102 09/08/2021  ? CO2 29 09/08/2021  ? ?Lab Results  ?Component Value Date  ?  ALT 19 09/08/2021  ? AST 18 09/08/2021  ? ALKPHOS 72 05/22/2015  ? BILITOT 0.5 09/08/2021  ? ?Lab Results  ?Component Value Date  ? HGBA1C 5.3 09/29/2021  ? HGBA1C 5.2 05/18/2019  ? ?Lab Results  ?Component Value Date  ? INSULIN 8.2 09/29/2021  ? ?Lab Results  ?Component Value Date  ? TSH 0.77 09/08/2021  ? ?Lab Results  ?Component Value Date  ? CHOL 206 (H)  09/08/2021  ? HDL 59 09/08/2021  ? LDLCALC 130 (H) 09/08/2021  ? TRIG 80 09/08/2021  ? CHOLHDL 3.5 09/08/2021  ? ?Lab Results  ?Component Value Date  ? VD25OH 27.5 (L) 09/29/2021  ? VD25OH 30 06/24/2017  ? VD25OH 34 05/22/2015  ? ?Lab Results  ?Component Value Date  ? WBC 7.0 09/08/2021  ? HGB 14.0 09/08/2021  ? HCT 41.7 09/08/2021  ? MCV 92.5 09/08/2021  ? PLT 284 09/08/2021  ? ?No results found for: IRON, TIBC, FERRITIN ? ?Attestation Statements:  ? ?Reviewed by clinician on day of visit: allergies, medications, problem list, medical history, surgical history, family history, social history, and previous encounter notes. ? ? ?I, Burt Knack, am acting as transcriptionist for Reuben Likes, MD. ? ?I have reviewed the above documentation for accuracy and completeness, and I agree with the above. Reuben Likes, MD ? ? ?

## 2021-11-05 ENCOUNTER — Other Ambulatory Visit (INDEPENDENT_AMBULATORY_CARE_PROVIDER_SITE_OTHER): Payer: Self-pay | Admitting: Bariatrics

## 2021-11-05 DIAGNOSIS — E559 Vitamin D deficiency, unspecified: Secondary | ICD-10-CM

## 2021-11-23 ENCOUNTER — Ambulatory Visit (INDEPENDENT_AMBULATORY_CARE_PROVIDER_SITE_OTHER): Payer: Managed Care, Other (non HMO) | Admitting: Family Medicine

## 2021-12-28 ENCOUNTER — Other Ambulatory Visit: Payer: Self-pay | Admitting: Internal Medicine

## 2022-03-12 DIAGNOSIS — M9901 Segmental and somatic dysfunction of cervical region: Secondary | ICD-10-CM | POA: Diagnosis not present

## 2022-03-12 DIAGNOSIS — M5386 Other specified dorsopathies, lumbar region: Secondary | ICD-10-CM | POA: Diagnosis not present

## 2022-03-12 DIAGNOSIS — M531 Cervicobrachial syndrome: Secondary | ICD-10-CM | POA: Diagnosis not present

## 2022-03-12 DIAGNOSIS — M9902 Segmental and somatic dysfunction of thoracic region: Secondary | ICD-10-CM | POA: Diagnosis not present

## 2022-03-16 DIAGNOSIS — M9901 Segmental and somatic dysfunction of cervical region: Secondary | ICD-10-CM | POA: Diagnosis not present

## 2022-03-16 DIAGNOSIS — M5386 Other specified dorsopathies, lumbar region: Secondary | ICD-10-CM | POA: Diagnosis not present

## 2022-03-16 DIAGNOSIS — M531 Cervicobrachial syndrome: Secondary | ICD-10-CM | POA: Diagnosis not present

## 2022-03-16 DIAGNOSIS — M9902 Segmental and somatic dysfunction of thoracic region: Secondary | ICD-10-CM | POA: Diagnosis not present

## 2022-03-17 ENCOUNTER — Encounter (INDEPENDENT_AMBULATORY_CARE_PROVIDER_SITE_OTHER): Payer: Self-pay

## 2022-03-18 DIAGNOSIS — M9902 Segmental and somatic dysfunction of thoracic region: Secondary | ICD-10-CM | POA: Diagnosis not present

## 2022-03-18 DIAGNOSIS — M9901 Segmental and somatic dysfunction of cervical region: Secondary | ICD-10-CM | POA: Diagnosis not present

## 2022-03-18 DIAGNOSIS — M5386 Other specified dorsopathies, lumbar region: Secondary | ICD-10-CM | POA: Diagnosis not present

## 2022-03-18 DIAGNOSIS — M531 Cervicobrachial syndrome: Secondary | ICD-10-CM | POA: Diagnosis not present

## 2022-03-24 DIAGNOSIS — M9901 Segmental and somatic dysfunction of cervical region: Secondary | ICD-10-CM | POA: Diagnosis not present

## 2022-03-24 DIAGNOSIS — M5386 Other specified dorsopathies, lumbar region: Secondary | ICD-10-CM | POA: Diagnosis not present

## 2022-03-24 DIAGNOSIS — M531 Cervicobrachial syndrome: Secondary | ICD-10-CM | POA: Diagnosis not present

## 2022-03-24 DIAGNOSIS — M9902 Segmental and somatic dysfunction of thoracic region: Secondary | ICD-10-CM | POA: Diagnosis not present

## 2022-03-25 ENCOUNTER — Other Ambulatory Visit: Payer: Self-pay | Admitting: Internal Medicine

## 2022-03-26 DIAGNOSIS — M5386 Other specified dorsopathies, lumbar region: Secondary | ICD-10-CM | POA: Diagnosis not present

## 2022-03-26 DIAGNOSIS — M531 Cervicobrachial syndrome: Secondary | ICD-10-CM | POA: Diagnosis not present

## 2022-03-26 DIAGNOSIS — M9902 Segmental and somatic dysfunction of thoracic region: Secondary | ICD-10-CM | POA: Diagnosis not present

## 2022-03-26 DIAGNOSIS — M9901 Segmental and somatic dysfunction of cervical region: Secondary | ICD-10-CM | POA: Diagnosis not present

## 2022-03-30 DIAGNOSIS — M9901 Segmental and somatic dysfunction of cervical region: Secondary | ICD-10-CM | POA: Diagnosis not present

## 2022-03-30 DIAGNOSIS — M531 Cervicobrachial syndrome: Secondary | ICD-10-CM | POA: Diagnosis not present

## 2022-03-30 DIAGNOSIS — M5386 Other specified dorsopathies, lumbar region: Secondary | ICD-10-CM | POA: Diagnosis not present

## 2022-03-30 DIAGNOSIS — M9902 Segmental and somatic dysfunction of thoracic region: Secondary | ICD-10-CM | POA: Diagnosis not present

## 2022-04-02 DIAGNOSIS — M5386 Other specified dorsopathies, lumbar region: Secondary | ICD-10-CM | POA: Diagnosis not present

## 2022-04-02 DIAGNOSIS — M9901 Segmental and somatic dysfunction of cervical region: Secondary | ICD-10-CM | POA: Diagnosis not present

## 2022-04-02 DIAGNOSIS — M531 Cervicobrachial syndrome: Secondary | ICD-10-CM | POA: Diagnosis not present

## 2022-04-02 DIAGNOSIS — M9902 Segmental and somatic dysfunction of thoracic region: Secondary | ICD-10-CM | POA: Diagnosis not present

## 2022-04-09 DIAGNOSIS — M9902 Segmental and somatic dysfunction of thoracic region: Secondary | ICD-10-CM | POA: Diagnosis not present

## 2022-04-09 DIAGNOSIS — M5386 Other specified dorsopathies, lumbar region: Secondary | ICD-10-CM | POA: Diagnosis not present

## 2022-04-09 DIAGNOSIS — M9901 Segmental and somatic dysfunction of cervical region: Secondary | ICD-10-CM | POA: Diagnosis not present

## 2022-04-09 DIAGNOSIS — M531 Cervicobrachial syndrome: Secondary | ICD-10-CM | POA: Diagnosis not present

## 2022-04-20 DIAGNOSIS — M9902 Segmental and somatic dysfunction of thoracic region: Secondary | ICD-10-CM | POA: Diagnosis not present

## 2022-04-20 DIAGNOSIS — M5386 Other specified dorsopathies, lumbar region: Secondary | ICD-10-CM | POA: Diagnosis not present

## 2022-04-20 DIAGNOSIS — M531 Cervicobrachial syndrome: Secondary | ICD-10-CM | POA: Diagnosis not present

## 2022-04-20 DIAGNOSIS — M9901 Segmental and somatic dysfunction of cervical region: Secondary | ICD-10-CM | POA: Diagnosis not present

## 2022-05-14 DIAGNOSIS — Z6829 Body mass index (BMI) 29.0-29.9, adult: Secondary | ICD-10-CM | POA: Diagnosis not present

## 2022-05-14 DIAGNOSIS — Z1151 Encounter for screening for human papillomavirus (HPV): Secondary | ICD-10-CM | POA: Diagnosis not present

## 2022-05-14 DIAGNOSIS — Z124 Encounter for screening for malignant neoplasm of cervix: Secondary | ICD-10-CM | POA: Diagnosis not present

## 2022-05-14 DIAGNOSIS — N393 Stress incontinence (female) (male): Secondary | ICD-10-CM | POA: Diagnosis not present

## 2022-05-14 DIAGNOSIS — Z01419 Encounter for gynecological examination (general) (routine) without abnormal findings: Secondary | ICD-10-CM | POA: Diagnosis not present

## 2022-05-17 LAB — HM PAP SMEAR: HPV, high-risk: NEGATIVE

## 2022-05-26 DIAGNOSIS — M5416 Radiculopathy, lumbar region: Secondary | ICD-10-CM | POA: Diagnosis not present

## 2022-05-26 DIAGNOSIS — M5412 Radiculopathy, cervical region: Secondary | ICD-10-CM | POA: Diagnosis not present

## 2022-06-02 DIAGNOSIS — M5412 Radiculopathy, cervical region: Secondary | ICD-10-CM | POA: Diagnosis not present

## 2022-06-02 DIAGNOSIS — M5416 Radiculopathy, lumbar region: Secondary | ICD-10-CM | POA: Diagnosis not present

## 2022-06-09 DIAGNOSIS — Z1231 Encounter for screening mammogram for malignant neoplasm of breast: Secondary | ICD-10-CM | POA: Diagnosis not present

## 2022-06-14 ENCOUNTER — Other Ambulatory Visit: Payer: Self-pay | Admitting: Obstetrics and Gynecology

## 2022-06-14 DIAGNOSIS — R928 Other abnormal and inconclusive findings on diagnostic imaging of breast: Secondary | ICD-10-CM

## 2022-06-18 DIAGNOSIS — M545 Low back pain, unspecified: Secondary | ICD-10-CM | POA: Diagnosis not present

## 2022-06-18 DIAGNOSIS — M5412 Radiculopathy, cervical region: Secondary | ICD-10-CM | POA: Diagnosis not present

## 2022-06-21 DIAGNOSIS — M5412 Radiculopathy, cervical region: Secondary | ICD-10-CM | POA: Diagnosis not present

## 2022-06-21 DIAGNOSIS — M545 Low back pain, unspecified: Secondary | ICD-10-CM | POA: Diagnosis not present

## 2022-06-29 ENCOUNTER — Ambulatory Visit: Admission: RE | Admit: 2022-06-29 | Payer: Self-pay | Source: Ambulatory Visit

## 2022-06-29 ENCOUNTER — Ambulatory Visit
Admission: RE | Admit: 2022-06-29 | Discharge: 2022-06-29 | Disposition: A | Discharge status: Home / Self Care | Payer: BC Managed Care – PPO | Source: Ambulatory Visit | Attending: Obstetrics and Gynecology | Admitting: Obstetrics and Gynecology

## 2022-06-29 DIAGNOSIS — R92322 Mammographic fibroglandular density, left breast: Secondary | ICD-10-CM | POA: Diagnosis not present

## 2022-06-29 DIAGNOSIS — R928 Other abnormal and inconclusive findings on diagnostic imaging of breast: Secondary | ICD-10-CM

## 2022-07-05 ENCOUNTER — Encounter: Payer: Self-pay | Admitting: Internal Medicine

## 2022-07-06 DIAGNOSIS — M5412 Radiculopathy, cervical region: Secondary | ICD-10-CM | POA: Diagnosis not present

## 2022-07-06 DIAGNOSIS — M545 Low back pain, unspecified: Secondary | ICD-10-CM | POA: Diagnosis not present

## 2022-07-15 DIAGNOSIS — N3946 Mixed incontinence: Secondary | ICD-10-CM | POA: Diagnosis not present

## 2022-07-15 DIAGNOSIS — M6281 Muscle weakness (generalized): Secondary | ICD-10-CM | POA: Diagnosis not present

## 2022-07-15 DIAGNOSIS — R278 Other lack of coordination: Secondary | ICD-10-CM | POA: Diagnosis not present

## 2022-08-12 DIAGNOSIS — N3946 Mixed incontinence: Secondary | ICD-10-CM | POA: Diagnosis not present

## 2022-08-12 DIAGNOSIS — M6281 Muscle weakness (generalized): Secondary | ICD-10-CM | POA: Diagnosis not present

## 2022-08-12 DIAGNOSIS — R278 Other lack of coordination: Secondary | ICD-10-CM | POA: Diagnosis not present

## 2022-08-13 DIAGNOSIS — M545 Low back pain, unspecified: Secondary | ICD-10-CM | POA: Diagnosis not present

## 2022-08-13 DIAGNOSIS — M5412 Radiculopathy, cervical region: Secondary | ICD-10-CM | POA: Diagnosis not present

## 2022-08-18 DIAGNOSIS — M5412 Radiculopathy, cervical region: Secondary | ICD-10-CM | POA: Diagnosis not present

## 2022-08-18 DIAGNOSIS — M545 Low back pain, unspecified: Secondary | ICD-10-CM | POA: Diagnosis not present

## 2022-08-19 DIAGNOSIS — M6281 Muscle weakness (generalized): Secondary | ICD-10-CM | POA: Diagnosis not present

## 2022-08-19 DIAGNOSIS — N3946 Mixed incontinence: Secondary | ICD-10-CM | POA: Diagnosis not present

## 2022-08-19 DIAGNOSIS — R278 Other lack of coordination: Secondary | ICD-10-CM | POA: Diagnosis not present

## 2022-08-24 DIAGNOSIS — M5412 Radiculopathy, cervical region: Secondary | ICD-10-CM | POA: Diagnosis not present

## 2022-08-24 DIAGNOSIS — M545 Low back pain, unspecified: Secondary | ICD-10-CM | POA: Diagnosis not present

## 2022-09-07 DIAGNOSIS — M5412 Radiculopathy, cervical region: Secondary | ICD-10-CM | POA: Diagnosis not present

## 2022-09-07 DIAGNOSIS — M545 Low back pain, unspecified: Secondary | ICD-10-CM | POA: Diagnosis not present

## 2022-10-08 DIAGNOSIS — M5412 Radiculopathy, cervical region: Secondary | ICD-10-CM | POA: Diagnosis not present

## 2022-10-08 DIAGNOSIS — M545 Low back pain, unspecified: Secondary | ICD-10-CM | POA: Diagnosis not present

## 2022-10-29 DIAGNOSIS — M5412 Radiculopathy, cervical region: Secondary | ICD-10-CM | POA: Diagnosis not present

## 2022-11-04 DIAGNOSIS — L538 Other specified erythematous conditions: Secondary | ICD-10-CM | POA: Diagnosis not present

## 2022-11-04 DIAGNOSIS — L821 Other seborrheic keratosis: Secondary | ICD-10-CM | POA: Diagnosis not present

## 2022-11-04 DIAGNOSIS — L570 Actinic keratosis: Secondary | ICD-10-CM | POA: Diagnosis not present

## 2022-11-04 DIAGNOSIS — L218 Other seborrheic dermatitis: Secondary | ICD-10-CM | POA: Diagnosis not present

## 2022-11-04 DIAGNOSIS — L814 Other melanin hyperpigmentation: Secondary | ICD-10-CM | POA: Diagnosis not present

## 2022-11-04 DIAGNOSIS — L578 Other skin changes due to chronic exposure to nonionizing radiation: Secondary | ICD-10-CM | POA: Diagnosis not present

## 2022-11-04 DIAGNOSIS — R208 Other disturbances of skin sensation: Secondary | ICD-10-CM | POA: Diagnosis not present

## 2022-11-04 DIAGNOSIS — L820 Inflamed seborrheic keratosis: Secondary | ICD-10-CM | POA: Diagnosis not present

## 2022-11-24 DIAGNOSIS — M5412 Radiculopathy, cervical region: Secondary | ICD-10-CM | POA: Diagnosis not present

## 2022-12-08 NOTE — Progress Notes (Signed)
Patient Care Team: Margaree Mackintosh, MD as PCP - General (Internal Medicine)  Visit Date: 12/21/22  Subjective:    Patient ID: Traci Rice , Female   DOB: 1981-01-07, 42 y.o.    MRN: [id number]   42 y.o. Female presents today for annual comprehensive physical exam. Patient has a past medical history of ADD 2013, dermatographia 2013, GERD, syncope in teen years.  She has had some ear congestion and sore throat recently. Has been spending time outside.  Planning neck surgery for 01/04/23 at [facility] with Dr. Sadie Haber. Has been having neck pain since 08/2021. Was doing monthly massages in 2023. Has tingling in right arm. Has lower right back pain that runs into her right leg.  History of anxiety and depression treated with alprazolam 0.25-0.5 mg up to twice daily as needed.  History of Vitamin D deficiency treated with Drisdol 50,000 units weekly. Vitamin D at 32.   Glucose normal. Kidney, liver function normal. Electrolytes normal. Blood proteins normal. CBC normal. LDL elevated at 123- lipids otherwise normal. TSH at 0.54.  Pap smear last completed 05/18/19. Negative for intraepithelial lesion or malignancy. Recommended repeat in 2025.  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  Has 2 children.  Employed full-time in [employer] position  at [employer] and is studying for [employer].  Mother has been diagnosed with lung cancer.  Parents operate [employer].Enjoys drinking wine, watching movies, shopping.      Family history: Maternal grandmother  with history of diabetes.  Mother with metastatic lung cancer.      Review of Systems  Constitutional:  Negative for chills, fever, malaise/fatigue and weight loss.  HENT:  Positive for ear pain (Congestion) and sore throat. Negative for congestion, hearing loss and sinus pain.   Eyes:  Negative for blurred vision.  Respiratory:  Negative for cough, hemoptysis and shortness of breath.   Cardiovascular:  Negative for chest pain, palpitations, leg swelling and PND.  Gastrointestinal:  Negative for abdominal pain, constipation, diarrhea, heartburn, nausea and vomiting.  Genitourinary:  Negative for dysuria, frequency and urgency.  Musculoskeletal:  Positive for back pain and neck pain. Negative for myalgias.  Skin:  Negative for itching and rash.  Neurological:  Negative for dizziness, tingling, seizures, loss of consciousness and headaches.  Endo/Heme/Allergies:  Negative for polydipsia.  Psychiatric/Behavioral:  Negative for depression. The patient is not nervous/anxious.         Objective:   Vitals: BP 98/72   Pulse 72   Temp 97.9 F (36.6 C) (Temporal)   Resp 16   Ht 5\' 6"  (1.676 m)   Wt 182 lb (82.6 kg)   SpO2 98%   BMI 29.38 kg/m  Physical Exam Vitals and nursing note reviewed.  Constitutional:      General: She is not in acute distress.    Appearance: Normal appearance. She is not ill-appearing or toxic-appearing.  HENT:     Head: Normocephalic and atraumatic.     Right Ear: Hearing, ear canal and external ear normal.     Left Ear: Hearing, ear canal and external ear normal.     Ears:     Comments: Right and left TM slightly full.    Mouth/Throat:     Pharynx: Oropharynx is clear. Posterior oropharyngeal erythema  present.     Comments: Pharynx injected without exudate. Eyes:     Extraocular Movements: Extraocular movements intact.     Pupils: Pupils are equal, round, and reactive to light.  Neck:     Thyroid: No thyroid mass, thyromegaly or thyroid tenderness.     Vascular: No carotid bruit.  Cardiovascular:     Rate and Rhythm: Normal rate and regular rhythm. No extrasystoles are present.    Pulses:          Dorsalis pedis pulses are 1+ on the right side and 1+ on the left side.     Heart sounds: Normal heart sounds. No murmur heard.    No friction rub. No gallop.  Pulmonary:     Effort: Pulmonary effort is normal.     Breath sounds: Normal breath sounds. No decreased breath sounds, wheezing, rhonchi or rales.  Chest:     Chest wall: No mass.  Abdominal:     Palpations: Abdomen is soft. There is no hepatomegaly, splenomegaly or mass.     Tenderness: There is no abdominal tenderness.     Hernia: No hernia is present.  Musculoskeletal:     Cervical back: Normal range of motion.     Right lower leg: No edema.     Left lower leg: No edema.     Comments: Muscle strength 5/5 in all groups tested.  Lymphadenopathy:     Cervical: No cervical adenopathy.     Upper Body:     Right upper body: No supraclavicular adenopathy.     Left upper body: No supraclavicular adenopathy.  Skin:    General: Skin is warm and dry.  Neurological:     General: No focal deficit present.     Mental Status: She is alert and oriented to person, place, and time. Mental status is at baseline.     Sensory: Sensation is intact.     Motor: Motor function is intact. No weakness.     Deep Tendon Reflexes: Reflexes are normal and symmetric.  Psychiatric:        Attention and Perception: Attention normal.        Mood and Affect: Mood normal.        Speech: Speech normal.        Behavior: Behavior normal.        Thought Content: Thought content normal.        Cognition and Memory: Cognition normal.        Judgment:  Judgment normal.       Results:   Studies obtained and personally reviewed by me:  Pap smear last completed 05/18/19. Negative for intraepithelial lesion or malignancy. Recommended repeat in 2025.   Labs:       Component Value Date/Time   NA 141 12/16/2022 0932   K 4.6 12/16/2022 0932   CL 105 12/16/2022 0932   CO2 24 12/16/2022 0932   GLUCOSE  94 12/16/2022 0932   BUN 12 12/16/2022 0932   CREATININE 0.59 12/16/2022 0932   CALCIUM 9.7 12/16/2022 0932   PROT 7.0 12/16/2022 0932   ALBUMIN 4.5 05/22/2015 0910   AST 13 12/16/2022 0932   ALT 15 12/16/2022 0932   ALKPHOS 72 05/22/2015 0910   BILITOT 0.5 12/16/2022 0932   GFRNONAA 118 08/21/2020 0919   GFRAA 137 08/21/2020 0919     Lab Results  Component Value Date   WBC 6.7 12/16/2022   HGB 13.6 12/16/2022   HCT 40.3 12/16/2022   MCV 90.4 12/16/2022   PLT 263 12/16/2022    Lab Results  Component Value Date   CHOL 196 12/16/2022   HDL 57 12/16/2022   LDLCALC 123 (H) 12/16/2022   TRIG 70 12/16/2022   CHOLHDL 3.4 12/16/2022    Lab Results  Component Value Date   HGBA1C 5.3 09/29/2021     Lab Results  Component Value Date   TSH 0.54 12/16/2022      Assessment & Plan:   Ear congestion, sore throat: Has bilateral serous otitis media.  Rapid strep screen negative. Prescribed Zithromax Z-Pak two tabs day 1 followed by one tab days 2-5, Diflucan 150 mg once if needed for Candida vaginitis. Can take Sudafed.  Anxiety and depression: treated with Alprazolam 0.25-0.5 mg up to twice daily as needed and Prozac 10 mg daily.  Anxiety and depression are longstanding and stable.  Vitamin D deficiency: Prescribed Drisdol 50,000 units weekly by mouth. Vitamin D level is  32.   Cervical radiculopathy being evaluated [facility] in [place] and surgery has been recommended  Elevated cholesterol: LDL slightly elevated at 123. She will work on controlling this with healthy diet and exercise. Will recheck this Fall.  Pap smear  last completed 05/18/19. Negative for intraepithelial lesion or malignancy. Recommended repeat in 2025. Pelvic and breast exams deferred to gynecologist. Urinalysis negative.  Vaccine counseling: UTD on flu, tetanus vaccines.  Cervical radiculopathy being evaluated at [facility] with surgery has been recommended and scheduled for May 28.  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  Has been taking tizanidine.  Also has been found to have disc protrusion C4-C5 and will be having two-level surgery on May 28.  I see no contraindications to cervical disc surgery.  Recommend return in the Fall for lipid check or as needed.  Statin has not been prescribed today.  Take vitamin D weekly as prescribed.  Continue Prozac and alprazolam.  Take Zithromax Z-Pak for acute bilateral serous otitis media and pharyngitis nonstrep.    I,Alexander Ruley,acting as a Neurosurgeon for Margaree Mackintosh, MD.,have documented all relevant documentation on the behalf of Margaree Mackintosh, MD,as directed by  Margaree Mackintosh, MD while in the presence of Margaree Mackintosh, MD.   I, Margaree Mackintosh, MD, have reviewed all documentation for this visit. The documentation on 12/26/22 for the exam, diagnosis, procedures, and orders are all accurate and complete.

## 2022-12-10 DIAGNOSIS — M5412 Radiculopathy, cervical region: Secondary | ICD-10-CM | POA: Diagnosis not present

## 2022-12-16 ENCOUNTER — Other Ambulatory Visit: Payer: BC Managed Care – PPO

## 2022-12-16 DIAGNOSIS — F419 Anxiety disorder, unspecified: Secondary | ICD-10-CM | POA: Diagnosis not present

## 2022-12-16 DIAGNOSIS — Z1331 Encounter for screening for depression: Secondary | ICD-10-CM

## 2022-12-16 DIAGNOSIS — E559 Vitamin D deficiency, unspecified: Secondary | ICD-10-CM | POA: Diagnosis not present

## 2022-12-16 DIAGNOSIS — M5412 Radiculopathy, cervical region: Secondary | ICD-10-CM | POA: Diagnosis not present

## 2022-12-16 DIAGNOSIS — R5383 Other fatigue: Secondary | ICD-10-CM | POA: Diagnosis not present

## 2022-12-16 DIAGNOSIS — E7849 Other hyperlipidemia: Secondary | ICD-10-CM | POA: Diagnosis not present

## 2022-12-16 NOTE — Addendum Note (Signed)
Addended by: Mary Sella D on: 12/16/2022 09:37 AM   Modules accepted: Orders

## 2022-12-17 LAB — COMPLETE METABOLIC PANEL WITH GFR
AG Ratio: 1.6 (calc) (ref 1.0–2.5)
ALT: 15 U/L (ref 6–29)
AST: 13 U/L (ref 10–30)
Albumin: 4.3 g/dL (ref 3.6–5.1)
Alkaline phosphatase (APISO): 57 U/L (ref 31–125)
BUN: 12 mg/dL (ref 7–25)
CO2: 24 mmol/L (ref 20–32)
Calcium: 9.7 mg/dL (ref 8.6–10.2)
Chloride: 105 mmol/L (ref 98–110)
Creat: 0.59 mg/dL (ref 0.50–0.99)
Globulin: 2.7 g/dL (calc) (ref 1.9–3.7)
Glucose, Bld: 94 mg/dL (ref 65–99)
Potassium: 4.6 mmol/L (ref 3.5–5.3)
Sodium: 141 mmol/L (ref 135–146)
Total Bilirubin: 0.5 mg/dL (ref 0.2–1.2)
Total Protein: 7 g/dL (ref 6.1–8.1)
eGFR: 117 mL/min/{1.73_m2} (ref >=60)

## 2022-12-17 LAB — CBC WITH DIFFERENTIAL/PLATELET
Absolute Monocytes: 603 cells/uL (ref 200–950)
Basophils Absolute: 27 cells/uL (ref 0–200)
Basophils Relative: 0.4 %
Eosinophils Absolute: 60 cells/uL (ref 15–500)
Eosinophils Relative: 0.9 %
HCT: 40.3 % (ref 35.0–45.0)
Hemoglobin: 13.6 g/dL (ref 11.7–15.5)
Lymphs Abs: 1648 cells/uL (ref 850–3900)
MCH: 30.5 pg (ref 27.0–33.0)
MCHC: 33.7 g/dL (ref 32.0–36.0)
MCV: 90.4 fL (ref 80.0–100.0)
MPV: 11.1 fL (ref 7.5–12.5)
Monocytes Relative: 9 %
Neutro Abs: 4362 cells/uL (ref 1500–7800)
Neutrophils Relative %: 65.1 %
Platelets: 263 10*3/uL (ref 140–400)
RBC: 4.46 10*6/uL (ref 3.80–5.10)
RDW: 12 % (ref 11.0–15.0)
Total Lymphocyte: 24.6 %
WBC: 6.7 10*3/uL (ref 3.8–10.8)

## 2022-12-17 LAB — LIPID PANEL
Cholesterol: 196 mg/dL (ref <200)
HDL: 57 mg/dL (ref >=50)
LDL Cholesterol (Calc): 123 mg/dL (calc) — ABNORMAL HIGH
Non-HDL Cholesterol (Calc): 139 mg/dL (calc) — ABNORMAL HIGH (ref <130)
Total CHOL/HDL Ratio: 3.4 (calc) (ref <5.0)
Triglycerides: 70 mg/dL (ref <150)

## 2022-12-17 LAB — TSH: TSH: 0.54 mIU/L

## 2022-12-17 LAB — VITAMIN D 25 HYDROXY (VIT D DEFICIENCY, FRACTURES): Vit D, 25-Hydroxy: 32 ng/mL (ref 30–100)

## 2022-12-20 DIAGNOSIS — M5412 Radiculopathy, cervical region: Secondary | ICD-10-CM | POA: Diagnosis not present

## 2022-12-21 ENCOUNTER — Encounter: Payer: Self-pay | Admitting: Internal Medicine

## 2022-12-21 ENCOUNTER — Ambulatory Visit: Payer: BC Managed Care – PPO | Admitting: Internal Medicine

## 2022-12-21 VITALS — BP 98/72 | HR 72 | Temp 97.9°F | Resp 16 | Ht 66.0 in | Wt 182.0 lb

## 2022-12-21 DIAGNOSIS — Z0000 Encounter for general adult medical examination without abnormal findings: Secondary | ICD-10-CM | POA: Diagnosis not present

## 2022-12-21 DIAGNOSIS — J029 Acute pharyngitis, unspecified: Secondary | ICD-10-CM | POA: Diagnosis not present

## 2022-12-21 DIAGNOSIS — H6503 Acute serous otitis media, bilateral: Secondary | ICD-10-CM

## 2022-12-21 DIAGNOSIS — M5000 Cervical disc disorder with myelopathy, unspecified cervical region: Secondary | ICD-10-CM | POA: Diagnosis not present

## 2022-12-21 DIAGNOSIS — E559 Vitamin D deficiency, unspecified: Secondary | ICD-10-CM | POA: Diagnosis not present

## 2022-12-21 LAB — POCT URINALYSIS DIPSTICK
Bilirubin, UA: NEGATIVE
Blood, UA: NEGATIVE
Glucose, UA: NEGATIVE
Ketones, UA: NEGATIVE
Leukocytes, UA: NEGATIVE
Nitrite, UA: NEGATIVE
Protein, UA: NEGATIVE
Spec Grav, UA: 1.015 (ref 1.010–1.025)
Urobilinogen, UA: 0.2 E.U./dL
pH, UA: 6 (ref 5.0–8.0)

## 2022-12-21 LAB — POCT RAPID STREP A (OFFICE): Rapid Strep A Screen: NEGATIVE

## 2022-12-21 MED ORDER — FLUCONAZOLE 150 MG PO TABS: 150.0000 mg | ORAL_TABLET | Freq: Once | ORAL | 1 refills | Status: AC

## 2022-12-21 MED ORDER — VITAMIN D (ERGOCALCIFEROL) 1.25 MG (50000 UNIT) PO CAPS: 50000.0000 [IU] | ORAL_CAPSULE | ORAL | 2 refills | Status: DC

## 2022-12-21 MED ORDER — AZITHROMYCIN 250 MG PO TABS: ORAL_TABLET | ORAL | 0 refills | Status: AC

## 2022-12-26 NOTE — Patient Instructions (Signed)
It was a pleasure to see you today.  You are cleared medically for surgery but being treated today for  an acute respiratory infection with Zithromax Z-Pak.  Rapid strep screen is negative.  You do have a bilateral serous otitis media.  Continue alprazolam and Prozac for anxiety and depression as previously prescribed.  Vitamin D 50,000 units weekly prescribed for history of vitamin D deficiency.  Vitamin D level is 32.  Recommend returning in the Fall for fasting lipid panel after recovering from surgery.  Lipid-lowering medicine has not been prescribed today.

## 2022-12-27 ENCOUNTER — Telehealth: Payer: Self-pay | Admitting: Internal Medicine

## 2022-12-27 DIAGNOSIS — M5412 Radiculopathy, cervical region: Secondary | ICD-10-CM | POA: Diagnosis not present

## 2022-12-27 NOTE — Telephone Encounter (Signed)
Faxed office note and labs to Dr Sadie Haber - Attn: Tobie Poet  Phone  [phone], fax [phone]                -------Fax Transmission Report-------  To:               Recipient at [phone] Subject:          Fw: Hp Scans Result:           The transmission was successful. Explanation:      All Pages Ok Pages Sent:       11 Connect Time:     5 minutes, 31 seconds Transmit Time:    12/27/2022 09:21 Transfer Rate:    14400 Status Code:      0000 Retry Count:      0 Job Id:           [id number] Unique Id:        [id number] Fax Line:         32 Fax Server:       [id number]

## 2023-06-16 NOTE — Progress Notes (Signed)
Patient Care Team: Margaree Mackintosh, MD as PCP - General (Internal Medicine)  Visit Date: 06/24/23  Subjective:    Patient ID: Traci Rice , Female   DOB: [birth year], 42 y.o.    MRN: [id number]   42 y.o. Female presents today for 42-month lipid recheck. Not currently taking medication. CHOL elevated at 221, LDL elevated at 139 on 06/23/23, up from 196 and 123 six months ago.  Discussed situational stress relating to work and care of mother, who has lung cancer metastatic to the brain. She has felt increased stress for the past 3 weeks. Taking alprazolam 0.25 - 0.5 mg twice daily as needed. This helps her sleep but she has periods of low mood on the following day.  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  Has 2 children.  Employed full-time in [employer] position  at [employer] and is studying for [employer].  Mother has been diagnosed with lung cancer.  Parents operate [employer].Enjoys drinking wine, watching movies, shopping.      Family history: Maternal grandmother  with history of diabetes.  Mother with metastatic lung cancer.      Review of Systems  Constitutional:  Negative for fever and malaise/fatigue.  HENT:  Negative for congestion.   Eyes:  Negative for blurred vision.  Respiratory:  Negative for cough  and shortness of breath.   Cardiovascular:  Negative for chest pain, palpitations and leg swelling.  Gastrointestinal:  Negative for vomiting.  Musculoskeletal:  Negative for back pain.  Skin:  Negative for rash.  Neurological:  Negative for loss of consciousness and headaches.  Psychiatric/Behavioral:  Positive for depression. The patient is nervous/anxious.         Objective:   Vitals: BP 100/80   Pulse 88   Ht 5\' 6"  (1.676 m)   Wt 178 lb (80.7 kg)   LMP 06/16/2023   SpO2 98%   BMI 28.73 kg/m    Physical Exam Vitals and nursing note reviewed.  Constitutional:      General: She is not in acute distress.    Appearance: Normal appearance. She is not toxic-appearing.  HENT:     Head: Normocephalic and atraumatic.  Pulmonary:     Effort: Pulmonary effort is normal.  Skin:    General: Skin is warm and dry.  Neurological:     Mental Status: She is alert and oriented to person, place, and time. Mental status is at baseline.  Psychiatric:        Mood and Affect: Mood normal.        Behavior: Behavior normal.        Thought Content: Thought content normal.        Judgment: Judgment normal.  Results:   Studies obtained and personally reviewed by me:   Labs:       Component Value Date/Time   NA 141 12/16/2022 0932   K 4.6 12/16/2022 0932   CL 105 12/16/2022 0932   CO2 24 12/16/2022 0932   GLUCOSE 94 12/16/2022 0932   BUN 12 12/16/2022 0932   CREATININE 0.59 12/16/2022 0932   CALCIUM 9.7 12/16/2022 0932   PROT 7.0 12/16/2022 0932   ALBUMIN 4.5 05/22/2015 0910   AST 13 12/16/2022 0932   ALT 15 12/16/2022 0932   ALKPHOS 72 05/22/2015 0910   BILITOT 0.5 12/16/2022 0932   GFRNONAA 118 08/21/2020 0919   GFRAA 137 08/21/2020 0919     Lab Results  Component Value Date   WBC 6.7 12/16/2022   HGB 13.6 12/16/2022   HCT 40.3 12/16/2022   MCV 90.4 12/16/2022   PLT 263 12/16/2022    Lab Results  Component Value Date   CHOL 221 (H) 06/23/2023   HDL 60  06/23/2023   LDLCALC 139 (H) 06/23/2023   TRIG 104 06/23/2023   CHOLHDL 3.7 06/23/2023    Lab Results  Component Value Date   HGBA1C 5.3 09/29/2021     Lab Results  Component Value Date   TSH 0.54 12/16/2022      Assessment & Plan:   Anxiety and depression: advised on taking alprazolam in the morning. Start sertraline 50 mg daily.Can increase dose to 100 mg daily if not improving in 4-6 weeks.  Hyperlipidemia: start rosuvastatin 5 mg daily.Follow up in 6 months  Refilled high dose Vitamin D Rx to take 50,000 units weekly  Return in 6 months for health maintenance exam or as needed.    I,Alexander Ruley,acting as a Neurosurgeon for Margaree Mackintosh, MD.,have documented all relevant documentation on the behalf of Margaree Mackintosh, MD,as directed by  Margaree Mackintosh, MD while in the presence of Margaree Mackintosh, MD.   I, Margaree Mackintosh, MD, have reviewed all documentation for this visit. The documentation on 07/08/23 for the exam, diagnosis, procedures, and orders are all accurate and complete.

## 2023-06-21 DIAGNOSIS — Z6829 Body mass index (BMI) 29.0-29.9, adult: Secondary | ICD-10-CM | POA: Diagnosis not present

## 2023-06-21 DIAGNOSIS — Z1231 Encounter for screening mammogram for malignant neoplasm of breast: Secondary | ICD-10-CM | POA: Diagnosis not present

## 2023-06-21 DIAGNOSIS — Z01419 Encounter for gynecological examination (general) (routine) without abnormal findings: Secondary | ICD-10-CM | POA: Diagnosis not present

## 2023-06-21 LAB — HM MAMMOGRAPHY

## 2023-06-23 ENCOUNTER — Other Ambulatory Visit: Payer: BC Managed Care – PPO

## 2023-06-23 DIAGNOSIS — E785 Hyperlipidemia, unspecified: Secondary | ICD-10-CM

## 2023-06-24 ENCOUNTER — Ambulatory Visit (INDEPENDENT_AMBULATORY_CARE_PROVIDER_SITE_OTHER): Payer: BC Managed Care – PPO | Admitting: Internal Medicine

## 2023-06-24 VITALS — BP 100/80 | HR 88 | Ht 66.0 in | Wt 178.0 lb

## 2023-06-24 DIAGNOSIS — E559 Vitamin D deficiency, unspecified: Secondary | ICD-10-CM | POA: Diagnosis not present

## 2023-06-24 DIAGNOSIS — E7800 Pure hypercholesterolemia, unspecified: Secondary | ICD-10-CM

## 2023-06-24 DIAGNOSIS — F419 Anxiety disorder, unspecified: Secondary | ICD-10-CM

## 2023-06-24 DIAGNOSIS — F439 Reaction to severe stress, unspecified: Secondary | ICD-10-CM

## 2023-06-24 DIAGNOSIS — F32A Depression, unspecified: Secondary | ICD-10-CM

## 2023-06-24 LAB — LIPID PANEL
Cholesterol: 221 mg/dL — ABNORMAL HIGH (ref <200)
HDL: 60 mg/dL (ref >=50)
LDL Cholesterol (Calc): 139 mg/dL — ABNORMAL HIGH
Non-HDL Cholesterol (Calc): 161 mg/dL — ABNORMAL HIGH (ref <130)
Total CHOL/HDL Ratio: 3.7 (calc) (ref <5.0)
Triglycerides: 104 mg/dL (ref <150)

## 2023-06-24 MED ORDER — SERTRALINE HCL 50 MG PO TABS: 50.0000 mg | ORAL_TABLET | Freq: Every day | ORAL | 1 refills | Status: DC

## 2023-06-24 MED ORDER — ROSUVASTATIN CALCIUM 5 MG PO TABS: 5.0000 mg | ORAL_TABLET | Freq: Every day | ORAL | 3 refills | Status: DC

## 2023-06-24 MED ORDER — ERGOCALCIFEROL 1.25 MG (50000 UT) PO CAPS: 50000.0000 [IU] | ORAL_CAPSULE | ORAL | 3 refills | Status: DC

## 2023-07-08 NOTE — Patient Instructions (Signed)
It was good to see you today.  Please start rosuvastatin 5 mg daily and follow-up here in 6 months.  Refilled high-dose vitamin D to take 50,000 units weekly.  Please start sertraline 50 mg daily and can increase to 100 mg daily if not improving in 4 to 6 weeks.  You can let me know.  Recommend taking alprazolam in the morning before work.

## 2023-09-20 ENCOUNTER — Other Ambulatory Visit: Payer: Self-pay | Admitting: Internal Medicine

## 2023-10-12 ENCOUNTER — Other Ambulatory Visit: Payer: Self-pay | Admitting: Internal Medicine

## 2023-10-13 MED ORDER — ALPRAZOLAM 0.5 MG PO TABS: ORAL_TABLET | ORAL | 1 refills | Status: AC

## 2023-10-13 NOTE — Telephone Encounter (Signed)
 CPE booked in May.

## 2023-11-08 DIAGNOSIS — D2262 Melanocytic nevi of left upper limb, including shoulder: Secondary | ICD-10-CM | POA: Diagnosis not present

## 2023-11-08 DIAGNOSIS — D2261 Melanocytic nevi of right upper limb, including shoulder: Secondary | ICD-10-CM | POA: Diagnosis not present

## 2023-11-08 DIAGNOSIS — L538 Other specified erythematous conditions: Secondary | ICD-10-CM | POA: Diagnosis not present

## 2023-11-08 DIAGNOSIS — L820 Inflamed seborrheic keratosis: Secondary | ICD-10-CM | POA: Diagnosis not present

## 2023-11-08 DIAGNOSIS — D225 Melanocytic nevi of trunk: Secondary | ICD-10-CM | POA: Diagnosis not present

## 2023-11-08 DIAGNOSIS — D2272 Melanocytic nevi of left lower limb, including hip: Secondary | ICD-10-CM | POA: Diagnosis not present

## 2023-12-23 ENCOUNTER — Other Ambulatory Visit

## 2023-12-23 DIAGNOSIS — E7800 Pure hypercholesterolemia, unspecified: Secondary | ICD-10-CM

## 2023-12-23 DIAGNOSIS — F439 Reaction to severe stress, unspecified: Secondary | ICD-10-CM

## 2023-12-23 DIAGNOSIS — R7302 Impaired glucose tolerance (oral): Secondary | ICD-10-CM | POA: Diagnosis not present

## 2023-12-23 DIAGNOSIS — E559 Vitamin D deficiency, unspecified: Secondary | ICD-10-CM | POA: Diagnosis not present

## 2023-12-23 DIAGNOSIS — F419 Anxiety disorder, unspecified: Secondary | ICD-10-CM | POA: Diagnosis not present

## 2023-12-23 DIAGNOSIS — Z0000 Encounter for general adult medical examination without abnormal findings: Secondary | ICD-10-CM

## 2023-12-23 DIAGNOSIS — F32A Depression, unspecified: Secondary | ICD-10-CM

## 2023-12-23 NOTE — Addendum Note (Signed)
 Addended by: Christiana Gurevich P on: 12/23/2023 09:21 AM   Modules accepted: Orders

## 2023-12-25 ENCOUNTER — Ambulatory Visit: Payer: Self-pay | Admitting: Internal Medicine

## 2023-12-26 NOTE — Progress Notes (Signed)
 Annual Wellness Visit   Patient Care Team: Sylvan Evener, MD as PCP - General (Internal Medicine)  Visit Date: 12/27/23   Chief Complaint  Patient presents with   Annual Exam   Subjective:  Patient: Traci Rice, Female DOB: 11/28/1980, 43 y.o. MRN: [id number] Kinzie Wickes is a 43 y.o. Female who presents today for her Annual Wellness Visit. Patient has Anxiety; Heart Palpitations; Hyperlipidemia; Mood Disorder - Emaotional Eating; and Class 1 Obesity Due To Excess Calories w/ Body Mass Index (BMI) 30.0 - 30.9 in Adult on their problem list.  History of Hyperlipidemia treated with  Rosuvastatin  5 mg daily. 12/23/2023 Lipid Panel: WNL.  History of BMI 25+, today 29.21 weighing 181 pounds.   History of Back Pain; Right-sided Cervical Radiculopathy treated with Tizanidine 4 mg three times daily as needed; had a C7 - T1 epidural injection April 2024. Was scheduled to have C5-6 ACDF on 01/04/2023 but never underwent surgery. She says that she is still having pain in her neck, right-sided with some occasional numbness/tingling in her shoulder that does sometimes radiates down to her right hand. Says that pain is temporarily relieved with massage pressure. Notes that her neck pain has been ongoing for about 2-3 years now, was initially intermittent and she did try physical therapy with temporary relief. Today also mentions that her right knee has started to bother her recently.   History of Vitamin-D Deficiency treated with Vitamin-D 50000 units weekly. 12/23/2023 Vitamin-D: 79.   History of Anxiety/Depression treated with Xanax  0.25 - 0.5 mg up twice daily as needed and Zoloft  50 mg daily.   Labs 12/23/2023 CBC: WNL CMP, compared 12/2022: Glucose 101, elevated from 94; otherwise WNL.   TSH: 0.50  Pap Smear and Mammogram done through GYN - requesting records.   Colonoscopy will be due 2028.  Bone Density will be due 2048.   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 Also had palpitations, subsequently referred to Cardiology where EKG was unremarkable, 2D echo and Holter monitor ordered but not done. Symptoms did not seem to be associated w/ breast-feeding and eventually resolved. It was felt that she likely had PACs or PVCs. Her labs (TSH, Magnesium, BMP) at the time were normal.  2021 - In December has Urticaria, possibly due to stress, and she was treated with Prednisone  x12 days, advised to take Zyrtec at night as a histamine blocker.    Circa 2006 - LEEP  2003 - History of Smoking, which she no longer does.  2001 - Vasovagal Syncope  2000 - presented here for Tonsillitis  Other - Surghx of: Left Ovarian Laparoscopy for Cyst Removal. Wisdom Teeth Extraction.  Family History  Problem Relation Age of Onset   Diabetes Mother    Obesity Mother    High Cholesterol Father    Obesity Father    Heart murmur Brother    Diabetes Maternal Grandmother    Kidney failure Maternal Grandmother    Diabetes Paternal Grandmother    Cancer Paternal Grandmother    Stroke Paternal Grandfather    Heart disease Paternal Aunt    Social History  Social History Narrative   2025 - Married. Has 2 children. 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 Recently graduated from [employer] with a Master's degree. Former smoker, enjoys drinking wine. Parents operate [employer] - Mother was diagnosed with lung cancer.      Fhx:    Paternal Grandmother w/ hx of Cancer (unknown) and Diabetes   Paternal Grandfather w/ hx of Stroke   Paternal Aunt w/ hx of  Heart Disease   Father w/ hx of High Cholesterol and Obesity   Maternal Grandmother w/ hx of Diabetes and Kidney Failure   Mother w/ hx of Obesity, Diabetes, Smoking, and Metastatic Lung Cancer   Brother w/ hx of Heart Murmur   Review of Systems  Constitutional:  Negative for chills, fever, malaise/fatigue and weight loss.  HENT:  Negative for hearing loss, sinus pain and sore throat.   Respiratory:  Negative for cough, hemoptysis and shortness of breath.   Cardiovascular:  Negative for chest pain, palpitations, leg swelling and PND.  Gastrointestinal:  Negative for abdominal pain, constipation, diarrhea, heartburn, nausea and vomiting.  Genitourinary:  Negative for dysuria, frequency and urgency.  Musculoskeletal:  Positive for back pain (lumbar), joint pain (knee, right) and neck pain. Negative for myalgias.  Skin:  Negative for itching and rash.  Neurological:  Negative for dizziness, tingling, seizures and headaches.  Endo/Heme/Allergies:  Negative for polydipsia.  Psychiatric/Behavioral:  Negative for depression. The patient is not nervous/anxious.     Objective:  Vitals: BP 102/80   Pulse 76   Ht 5\' 6"  (1.676 m)   Wt 181 lb (82.1 kg)   LMP 12/22/2023   SpO2 96%   BMI 29.21 kg/m  Physical Exam Vitals and nursing note reviewed.  Constitutional:      General: She is not in acute distress.    Appearance: Normal appearance. She is not ill-appearing or toxic-appearing.  HENT:     Head: Normocephalic and atraumatic.     Right Ear: Hearing, tympanic membrane, ear canal and external ear normal.     Left Ear: Hearing, tympanic membrane, ear canal and external ear normal.     Mouth/Throat:     Pharynx: Oropharynx is clear.  Eyes:     Extraocular Movements: Extraocular movements intact.     Pupils: Pupils are equal, round, and reactive to light.  Neck:     Thyroid: No thyroid mass, thyromegaly or thyroid tenderness.     Vascular: No carotid bruit.  Cardiovascular:     Rate and  Rhythm: Normal rate and regular rhythm. No extrasystoles are present.    Pulses:          Dorsalis pedis pulses are 2+ on the right side and 2+ on the left side.     Heart sounds: Normal heart sounds. No murmur heard.    No friction rub. No gallop.  Pulmonary:     Effort: Pulmonary effort is normal.     Breath sounds: Normal breath sounds. No decreased breath sounds, wheezing, rhonchi or rales.  Chest:     Chest wall: No mass.  Breasts:    Right: Normal. No mass.     Left: Normal. No mass.  Abdominal:     General: Bowel sounds are normal.     Palpations: Abdomen is soft. There is no hepatomegaly, splenomegaly or mass.     Tenderness: There is no abdominal tenderness.     Hernia: No hernia is present.  Musculoskeletal:     Right  shoulder: No tenderness or bony tenderness. Normal range of motion. Decreased strength.     Cervical back: Normal range of motion.     Right lower leg: No edema.     Left lower leg: No edema.  Lymphadenopathy:     Cervical: No cervical adenopathy.     Upper Body:     Right upper body: No supraclavicular adenopathy.     Left upper body: No supraclavicular adenopathy.  Skin:    General: Skin is warm and dry.  Neurological:     General: No focal deficit present.     Mental Status: She is alert and oriented to person, place, and time. Mental status is at baseline.     Sensory: Sensation is intact.     Motor: Motor function is intact. No weakness.     Deep Tendon Reflexes: Reflexes are normal and symmetric.  Psychiatric:        Attention and Perception: Attention normal.        Mood and Affect: Mood normal.        Speech: Speech normal.        Behavior: Behavior normal.        Thought Content: Thought content normal.        Cognition and Memory: Cognition normal.        Judgment: Judgment normal.   Most Recent Fall Risk Assessment:    12/21/2022   11:01 AM  Fall Risk   Falls in the past year? 0  Number falls in past yr: 0  Injury with Fall? 0   Risk for fall due to : No Fall Risks  Follow up Falls evaluation completed   Most Recent Depression Screenings:    12/27/2023   11:00 AM 12/21/2022   11:02 AM  PHQ 2/9 Scores  PHQ - 2 Score 0 2   Results:  Studies Obtained And Personally Reviewed By Me: Labs:     Component Value Date/Time   NA 139 12/23/2023 1001   K 4.8 12/23/2023 1001   CL 104 12/23/2023 1001   CO2 29 12/23/2023 1001   GLUCOSE 101 (H) 12/23/2023 1001   BUN 13 12/23/2023 1001   CREATININE 0.60 12/23/2023 1001   CALCIUM  9.7 12/23/2023 1001   PROT 6.7 12/23/2023 1001   ALBUMIN 4.5 05/22/2015 0910   AST 13 12/23/2023 1001   ALT 16 12/23/2023 1001   ALKPHOS 72 05/22/2015 0910   BILITOT 0.6 12/23/2023 1001   GFRNONAA 118 08/21/2020 0919   GFRAA 137 08/21/2020 0919    Lab Results  Component Value Date   WBC 5.7 12/23/2023   HGB 13.6 12/23/2023   HCT 42.1 12/23/2023   MCV 95.2 12/23/2023   PLT 246 12/23/2023   Lab Results  Component Value Date   CHOL 151 12/23/2023   HDL 59 12/23/2023   LDLCALC 77 12/23/2023   TRIG 72 12/23/2023   CHOLHDL 2.6 12/23/2023   Lab Results  Component Value Date   HGBA1C 5.3 09/29/2021    Lab Results  Component Value Date   TSH 0.50 12/23/2023    Assessment & Plan:   Orders Placed This Encounter  Procedures   Hemoglobin A1c   POCT URINALYSIS DIP (CLINITEK)  Other Labs Reviewed today: CBC: WNL CMP, compared 12/2022: Glucose 101, elevated from 94; otherwise WNL.   TSH: 0.50  Hyperlipidemia treated with  Rosuvastatin  5 mg daily. 12/23/2023 Lipid Panel: WNL.  Elevated Glucose 101, elevated from 94 in 12/2022. Ordering Hemoglobin A1c. Addendum: Hgb AIC is normal.  BMI 25+, today 29.21 weighing 181 pounds. Discuss diet and exercise  Situational stress- mother has metastatic cancer  Back Pain; Right-sided Cervical Radiculopathy treated with Tizanidine 4 mg three times daily as needed; had a C7 - T1 epidural injection April 2024. Was scheduled to have C5-6 ACDF on  01/04/2023 but never underwent surgery. She says that she is still having pain in her neck, right-sided with some occasional numbness/tingling in her shoulder that does sometimes radiates down to her right hand. Says that pain is temporarily relieved with massage pressure. Notes that her neck pain has been ongoing for about 2-3 years now, was initially intermittent and she did try physical therapy with temporary relief. Today also mentions that her right knee has started to bother her recently.  According to records from care everywhere she was scheduled for cervical disc surgery C4-C6 in May 2024 but patient says this surgery was abruptly canceled on the day of the surgery.  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  Vitamin-D Deficiency treated with Vitamin-D 50000 units weekly. 12/23/2023 Vitamin-D: 79.   Anxiety/Depression treated with Xanax  0.25 - 0.5 mg up twice daily as needed and Zoloft  50 mg daily.   Pap Smear and Mammogram done through GYN - requesting records.   Colonoscopy will be due 2028.  Bone Density will be due 2048.   Vaccine Counseling: UTD on Flu and Tdap  Plan: Patient would like to proceed with evaluation with [facility] neuro[facility].  Referral will be placed.  Patient still has right-sided neck pain that she would like reevaluated.  Patient did not proceed with neck surgery at [facility] in [place] in May 2024.  Still has right-sided neck pain with paresthesias in right upper extremity.     Annual wellness visit done today including the all of the following: Reviewed patient's Family Medical History Reviewed and updated list of patient's medical providers Assessment of cognitive impairment was done Assessed patient's functional ability Established a written schedule for health screening services Health Risk Assessent Completed and Reviewed  Discussed health benefits of physical activity, and encouraged her to engage in regular exercise appropriate for  her age and condition.    I,Emily Lagle,acting as a Neurosurgeon for Sylvan Evener, MD.,have documented all relevant documentation on the behalf of Sylvan Evener, MD,as directed by  Sylvan Evener, MD while in the presence of Sylvan Evener, MD.   I, Sylvan Evener, MD, have reviewed all documentation for this visit. The documentation on 12/29/23 for the exam, diagnosis, procedures, and orders are all accurate and complete.

## 2023-12-27 ENCOUNTER — Ambulatory Visit (INDEPENDENT_AMBULATORY_CARE_PROVIDER_SITE_OTHER): Admitting: Internal Medicine

## 2023-12-27 VITALS — BP 102/80 | HR 76 | Ht 66.0 in | Wt 181.0 lb

## 2023-12-27 DIAGNOSIS — E7800 Pure hypercholesterolemia, unspecified: Secondary | ICD-10-CM | POA: Diagnosis not present

## 2023-12-27 DIAGNOSIS — M5412 Radiculopathy, cervical region: Secondary | ICD-10-CM

## 2023-12-27 DIAGNOSIS — R202 Paresthesia of skin: Secondary | ICD-10-CM | POA: Diagnosis not present

## 2023-12-27 DIAGNOSIS — Z6829 Body mass index (BMI) 29.0-29.9, adult: Secondary | ICD-10-CM

## 2023-12-27 DIAGNOSIS — R7302 Impaired glucose tolerance (oral): Secondary | ICD-10-CM | POA: Diagnosis not present

## 2023-12-27 DIAGNOSIS — E559 Vitamin D deficiency, unspecified: Secondary | ICD-10-CM | POA: Diagnosis not present

## 2023-12-27 DIAGNOSIS — M542 Cervicalgia: Secondary | ICD-10-CM

## 2023-12-27 DIAGNOSIS — Z0000 Encounter for general adult medical examination without abnormal findings: Secondary | ICD-10-CM | POA: Diagnosis not present

## 2023-12-27 DIAGNOSIS — Z8659 Personal history of other mental and behavioral disorders: Secondary | ICD-10-CM

## 2023-12-27 DIAGNOSIS — F439 Reaction to severe stress, unspecified: Secondary | ICD-10-CM

## 2023-12-27 LAB — POCT URINALYSIS DIP (CLINITEK)
Bilirubin, UA: NEGATIVE
Blood, UA: NEGATIVE
Glucose, UA: NEGATIVE mg/dL
Ketones, POC UA: NEGATIVE mg/dL
Leukocytes, UA: NEGATIVE
Nitrite, UA: NEGATIVE
POC PROTEIN,UA: NEGATIVE
Spec Grav, UA: <=1.005 (ref 1.010–1.025)
Urobilinogen, UA: 0.2 U/dL
pH, UA: 6 (ref 5.0–8.0)

## 2023-12-28 ENCOUNTER — Other Ambulatory Visit: Payer: Self-pay | Admitting: *Deleted

## 2023-12-28 DIAGNOSIS — M5000 Cervical disc disorder with myelopathy, unspecified cervical region: Secondary | ICD-10-CM

## 2023-12-28 DIAGNOSIS — M542 Cervicalgia: Secondary | ICD-10-CM

## 2023-12-28 LAB — COMPLETE METABOLIC PANEL WITHOUT GFR
AG Ratio: 1.9 (calc) (ref 1.0–2.5)
ALT: 16 U/L (ref 6–29)
AST: 13 U/L (ref 10–30)
Albumin: 4.4 g/dL (ref 3.6–5.1)
Alkaline phosphatase (APISO): 71 U/L (ref 31–125)
BUN: 13 mg/dL (ref 7–25)
CO2: 29 mmol/L (ref 20–32)
Calcium: 9.7 mg/dL (ref 8.6–10.2)
Chloride: 104 mmol/L (ref 98–110)
Creat: 0.6 mg/dL (ref 0.50–0.99)
Globulin: 2.3 g/dL (ref 1.9–3.7)
Glucose, Bld: 101 mg/dL — ABNORMAL HIGH (ref 65–99)
Potassium: 4.8 mmol/L (ref 3.5–5.3)
Sodium: 139 mmol/L (ref 135–146)
Total Bilirubin: 0.6 mg/dL (ref 0.2–1.2)
Total Protein: 6.7 g/dL (ref 6.1–8.1)

## 2023-12-28 LAB — CBC WITH DIFFERENTIAL/PLATELET
Absolute Lymphocytes: 1362 {cells}/uL (ref 850–3900)
Absolute Monocytes: 536 {cells}/uL (ref 200–950)
Basophils Absolute: 29 {cells}/uL (ref 0–200)
Basophils Relative: 0.5 %
Eosinophils Absolute: 91 {cells}/uL (ref 15–500)
Eosinophils Relative: 1.6 %
HCT: 42.1 % (ref 35.0–45.0)
Hemoglobin: 13.6 g/dL (ref 11.7–15.5)
MCH: 30.8 pg (ref 27.0–33.0)
MCHC: 32.3 g/dL (ref 32.0–36.0)
MCV: 95.2 fL (ref 80.0–100.0)
MPV: 11.1 fL (ref 7.5–12.5)
Monocytes Relative: 9.4 %
Neutro Abs: 3682 {cells}/uL (ref 1500–7800)
Neutrophils Relative %: 64.6 %
Platelets: 246 10*3/uL (ref 140–400)
RBC: 4.42 10*6/uL (ref 3.80–5.10)
RDW: 12 % (ref 11.0–15.0)
Total Lymphocyte: 23.9 %
WBC: 5.7 10*3/uL (ref 3.8–10.8)

## 2023-12-28 LAB — HEMOGLOBIN A1C W/OUT EAG: Hgb A1c MFr Bld: 5.4 % (ref <5.7)

## 2023-12-28 LAB — LIPID PANEL
Cholesterol: 151 mg/dL (ref <200)
HDL: 59 mg/dL (ref >=50)
LDL Cholesterol (Calc): 77 mg/dL
Non-HDL Cholesterol (Calc): 92 mg/dL (ref <130)
Total CHOL/HDL Ratio: 2.6 (calc) (ref <5.0)
Triglycerides: 72 mg/dL (ref <150)

## 2023-12-28 LAB — TEST AUTHORIZATION

## 2023-12-28 LAB — VITAMIN D 25 HYDROXY (VIT D DEFICIENCY, FRACTURES): Vit D, 25-Hydroxy: 79 ng/mL (ref 30–100)

## 2023-12-28 LAB — TSH: TSH: 0.5 m[IU]/L

## 2023-12-29 NOTE — Patient Instructions (Addendum)
 Patient says she still has right radiculopathy symptoms and that cervidal disc surgery was abruptly cancelled in [place] last year which was to have been performed by surgeon associated with [facility] in [place].

## 2024-04-20 DIAGNOSIS — M47812 Spondylosis without myelopathy or radiculopathy, cervical region: Secondary | ICD-10-CM | POA: Diagnosis not present

## 2024-04-20 DIAGNOSIS — Z6831 Body mass index (BMI) 31.0-31.9, adult: Secondary | ICD-10-CM | POA: Diagnosis not present

## 2024-06-12 ENCOUNTER — Other Ambulatory Visit: Payer: Self-pay | Admitting: Internal Medicine

## 2024-06-21 DIAGNOSIS — Z1231 Encounter for screening mammogram for malignant neoplasm of breast: Secondary | ICD-10-CM | POA: Diagnosis not present

## 2024-06-21 DIAGNOSIS — Z01419 Encounter for gynecological examination (general) (routine) without abnormal findings: Secondary | ICD-10-CM | POA: Diagnosis not present

## 2024-06-21 DIAGNOSIS — Z6830 Body mass index (BMI) 30.0-30.9, adult: Secondary | ICD-10-CM | POA: Diagnosis not present

## 2024-07-28 ENCOUNTER — Other Ambulatory Visit: Payer: Self-pay | Admitting: Internal Medicine
